# Patient Record
Sex: Female | Born: 1935 | Race: White | Hispanic: No | Smoking: Never smoker
Health system: Southern US, Community
[De-identification: ages and names within clinical notes are randomized; demographics above are authoritative.]

## PROBLEM LIST (undated history)

## (undated) DIAGNOSIS — I2510 Atherosclerotic heart disease of native coronary artery without angina pectoris: Secondary | ICD-10-CM

## (undated) DIAGNOSIS — E785 Hyperlipidemia, unspecified: Secondary | ICD-10-CM

## (undated) DIAGNOSIS — F0280 Dementia in other diseases classified elsewhere without behavioral disturbance: Secondary | ICD-10-CM

## (undated) DIAGNOSIS — I10 Essential (primary) hypertension: Secondary | ICD-10-CM

## (undated) DIAGNOSIS — I519 Heart disease, unspecified: Secondary | ICD-10-CM

## (undated) HISTORY — PX: CHOLECYSTECTOMY: SHX55

## (undated) HISTORY — PX: CORONARY STENT PLACEMENT: SHX1402

## (undated) HISTORY — PX: EYE SURGERY: SHX253

## (undated) HISTORY — PX: ANGIOPLASTY: SHX39

## (undated) HISTORY — PX: ABDOMINAL HYSTERECTOMY: SHX81

---

## 2005-01-07 HISTORY — PX: CORONARY STENT PLACEMENT: SHX1402

## 2014-06-14 ENCOUNTER — Encounter: Payer: Self-pay | Admitting: *Deleted

## 2014-06-14 ENCOUNTER — Emergency Department (INDEPENDENT_AMBULATORY_CARE_PROVIDER_SITE_OTHER)
Admission: EM | Admit: 2014-06-14 | Discharge: 2014-06-14 | Disposition: A | Discharge status: Home / Self Care | Payer: Medicare Other | Source: Home / Self Care | Attending: Family Medicine | Admitting: Family Medicine

## 2014-06-14 DIAGNOSIS — I10 Essential (primary) hypertension: Secondary | ICD-10-CM | POA: Diagnosis not present

## 2014-06-14 DIAGNOSIS — Z136 Encounter for screening for cardiovascular disorders: Secondary | ICD-10-CM | POA: Diagnosis not present

## 2014-06-14 DIAGNOSIS — J0121 Acute recurrent ethmoidal sinusitis: Secondary | ICD-10-CM

## 2014-06-14 HISTORY — DX: Essential (primary) hypertension: I10

## 2014-06-14 HISTORY — DX: Heart disease, unspecified: I51.9

## 2014-06-14 MED ORDER — LEVOFLOXACIN 750 MG PO TABS: 750.0000 mg | ORAL_TABLET | Freq: Every day | ORAL | Status: DC

## 2014-06-14 NOTE — ED Provider Notes (Addendum)
CSN: [id number]     Arrival date & time 06/14/14  1151 History   First MD Initiated Contact with Patient 06/14/14 1153     Chief Complaint  Patient presents with  . Facial Pain  . Otalgia    HPI  SINUSITIS Onset:  2-3 weeks  Location: frontal/ethmoid sinuses  Description:sinus pain pressure/persistent discomfort   Modifying factors: was seen at PCP for similar sxs on 5/27- was placed on extended course of omnicef w/ mild improvement in sxs-still with moderate to severe pain.   Symptoms Cough:  no Discharge:  no Fever: no Sinus Pressure:  yes Ears Blocked:  no Teeth Ache:  mild Frontal Headache:  yes Second Sickening:  no  Red Flags Change in mental state: no Change in vision: no    Past Medical History  Diagnosis Date  . Hypertension   . Heart disease    Past Surgical History  Procedure Laterality Date  . Cholecystectomy    . Abdominal hysterectomy    . Eye surgery    . Coronary stent placement    . Angioplasty     Family History  Problem Relation Age of Onset  . Diabetes Mother   . Alzheimer's disease Father   . Glaucoma Sister   . Diabetes Brother    History  Substance Use Topics  . Smoking status: Never Smoker   . Smokeless tobacco: Never Used  . Alcohol Use: No   OB History    No data available     Review of Systems  All other systems reviewed and are negative.   Allergies  Atenolol; Biaxin; Codeine; Methotrexate derivatives; Metoprolol; Penicillins; Plaquenil; and Sulfur  Home Medications   Prior to Admission medications   Medication Sig Start Date End Date Taking? Authorizing Provider  amLODipine (NORVASC) 10 MG tablet Take 20 mg by mouth daily.   Yes Historical Provider, MD  aspirin 81 MG tablet Take 81 mg by mouth daily.   Yes Historical Provider, MD  carvedilol (COREG) 3.125 MG tablet Take 3.125 mg by mouth 2 (two) times daily with a meal.   Yes Historical Provider, MD  omeprazole (PRILOSEC) 20 MG capsule Take 20 mg by mouth daily.    Yes Historical Provider, MD  Turmeric 500 MG CAPS Take by mouth.   Yes Historical Provider, MD   BP 158/74 mmHg  Pulse 72  Temp(Src) 98.1 F (36.7 C) (Oral)  Resp 16  Ht 5' 1.5" (1.562 m)  Wt 185 lb (83.915 kg)  BMI 34.39 kg/m2  SpO2 98% Physical Exam  Constitutional: She appears well-developed and well-nourished.  HENT:  Head: Normocephalic and atraumatic.  Right Ear: External ear normal.  Left Ear: External ear normal.  + maxillary and ethmoid sinus TTP    Eyes: Conjunctivae are normal. Pupils are equal, round, and reactive to light.  Neck: Normal range of motion.  Cardiovascular: Normal rate and regular rhythm.   Pulmonary/Chest: Effort normal.  Abdominal: Soft.  Musculoskeletal: Normal range of motion.  Lymphadenopathy:    She has cervical adenopathy.  Neurological: She is alert.  Skin: Skin is warm.    ED Course  Procedures (including critical care time) Labs Review Labs Reviewed - No data to display EKG: NSR, QT WNL Imaging Review No results found.   MDM   1. Recurrent ethmoidal sinusitis, unspecified chronicity    Will treat with extended course of levaquin given failed course of omnicef Discussed infectious and ENT red flags at length Screening EKG done w/ NSR and QTc @  405 msec in setting of baseline CAD Follow up w/ ENT if sxs worsen/fail to improve    The patient and/or caregiver has been counseled thoroughly with regard to treatment plan and/or medications prescribed including dosage, schedule, interactions, rationale for use, and possible side effects and they verbalize understanding. Diagnoses and expected course of recovery discussed and will return if not improved as expected or if the condition worsens. Patient and/or caregiver verbalized understanding.         Floydene Flock, MD 06/14/14 1243  Floydene Flock, MD 06/14/14 [phone]

## 2014-06-14 NOTE — ED Notes (Signed)
Pt was treated for sinus infection and lymphadenitis on 06/03/14 with Omnicef. She completed treatment yesterday with minimal improvement. Still c/o left shoulder pain, ear pain and sinus pressure.

## 2015-04-17 ENCOUNTER — Encounter: Payer: Self-pay | Admitting: *Deleted

## 2015-04-17 ENCOUNTER — Emergency Department (INDEPENDENT_AMBULATORY_CARE_PROVIDER_SITE_OTHER)
Admission: EM | Admit: 2015-04-17 | Discharge: 2015-04-17 | Disposition: A | Discharge status: Home / Self Care | Payer: Medicare Other | Source: Home / Self Care | Attending: Family Medicine | Admitting: Family Medicine

## 2015-04-17 DIAGNOSIS — B9789 Other viral agents as the cause of diseases classified elsewhere: Principal | ICD-10-CM

## 2015-04-17 DIAGNOSIS — J069 Acute upper respiratory infection, unspecified: Secondary | ICD-10-CM | POA: Diagnosis not present

## 2015-04-17 MED ORDER — BENZONATATE 200 MG PO CAPS: 200.0000 mg | ORAL_CAPSULE | Freq: Every day | ORAL | Status: DC

## 2015-04-17 MED ORDER — PREDNISONE 20 MG PO TABS: ORAL_TABLET | ORAL | Status: DC

## 2015-04-17 MED ORDER — DOXYCYCLINE HYCLATE 100 MG PO CAPS: 100.0000 mg | ORAL_CAPSULE | Freq: Two times a day (BID) | ORAL | Status: DC

## 2015-04-17 NOTE — ED Notes (Signed)
Pt c/o productive cough and sinus pain x 4 days. Denies fever. She is taking Robt and Mucinex.

## 2015-04-17 NOTE — ED Provider Notes (Signed)
CSN: [id number]     Arrival date & time 04/17/15  1020 History   First MD Initiated Contact with Patient 04/17/15 1200     Chief Complaint  Patient presents with  . Cough      HPI Comments:  Patient complains of five day history of typical cold-like symptoms developing over several days,  including mild sore throat, sinus congestion, fatigue, and cough.  No fevers, chills, and sweats. She has a history of perennial rhinitis and recurrent sinusitis.  She states that colds tend to last a long time. She has a past history of asthma as a child.  Her mother and brother had asthma.  The history is provided by the patient.    Past Medical History  Diagnosis Date  . Hypertension   . Heart disease    Past Surgical History  Procedure Laterality Date  . Cholecystectomy    . Abdominal hysterectomy    . Eye surgery    . Coronary stent placement    . Angioplasty     Family History  Problem Relation Age of Onset  . Diabetes Mother   . Alzheimer's disease Father   . Glaucoma Sister   . Diabetes Brother    Social History  Substance Use Topics  . Smoking status: Never Smoker   . Smokeless tobacco: Never Used  . Alcohol Use: No   OB History    No data available     Review of Systems + sore throat + cough No pleuritic pain No wheezing + nasal congestion + post-nasal drainage + sinus pain/pressure No itchy/red eyes No earache No hemoptysis No SOB No fever/chills No nausea No vomiting No abdominal pain No diarrhea No urinary symptoms No skin rash + fatigue No myalgias No headache Used OTC meds without relief  Allergies  Atenolol; Biaxin; Codeine; Methotrexate derivatives; Metoprolol; Penicillins; Plaquenil; and Sulfur  Home Medications   Prior to Admission medications   Medication Sig Start Date End Date Taking? Authorizing Provider  amLODipine (NORVASC) 10 MG tablet Take 20 mg by mouth daily.   Yes Historical Provider, MD  carvedilol (COREG) 3.125 MG tablet Take  3.125 mg by mouth 2 (two) times daily with a meal.   Yes Historical Provider, MD  DULoxetine (CYMBALTA) 20 MG capsule Take 20 mg by mouth daily.   Yes Historical Provider, MD  furosemide (LASIX) 20 MG tablet Take 20 mg by mouth.   Yes Historical Provider, MD  omeprazole (PRILOSEC) 20 MG capsule Take 20 mg by mouth daily.   Yes Historical Provider, MD  POTASSIUM PO Take by mouth.   Yes Historical Provider, MD  aspirin 81 MG tablet Take 81 mg by mouth daily.    Historical Provider, MD  benzonatate (TESSALON) 200 MG capsule Take 1 capsule (200 mg total) by mouth at bedtime. Take as needed for cough 04/17/15   Lattie Haw, MD  doxycycline (VIBRAMYCIN) 100 MG capsule Take 1 capsule (100 mg total) by mouth 2 (two) times daily. (Rx void after 04/25/15) 04/17/15   Lattie Haw, MD  predniSONE (DELTASONE) 20 MG tablet Take one tab by mouth twice daily for 5 days, then one daily for 3 days. Take with food. 04/17/15   Lattie Haw, MD  Turmeric 500 MG CAPS Take by mouth.    Historical Provider, MD   Meds Ordered and Administered this Visit  Medications - No data to display  BP 125/87 mmHg  Pulse 103  Temp(Src) 98 F (36.7 C) (Oral)  Resp 16  Ht 5' 1.5" (1.562 m)  Wt 184 lb (83.462 kg)  BMI 34.21 kg/m2  SpO2 97% No data found.   Physical Exam Nursing notes and Vital Signs reviewed. Appearance:  Patient appears stated age, and in no acute distress Eyes:  Pupils are equal, round, and reactive to light and accomodation.  Extraocular movement is intact.  Conjunctivae are not inflamed  Ears:  Canals occluded with cerumen. Nose:  Mildly congested turbinates.  No sinus tenderness.    Pharynx:   Uvula mildly edematous, otherwise normal. Neck:  Supple.  Tender enlarged posterior/lateral nodes are palpated bilaterally  Lungs:   Scattered rhonchi posteriorly.  Breath sounds are equal.  Moving air well. Heart:  Regular rate and rhythm without murmurs, rubs, or gallops.  Abdomen:  Nontender without  masses or hepatosplenomegaly.  Bowel sounds are present.  No CVA or flank tenderness.  Extremities:  No edema.  Skin:  No rash present.   ED Course  Procedures  None    MDM   1. Viral URI with cough    Suspect mild bronchospasm. There is no evidence of bacterial infection today.   Begin prednisone burst/taper.  Prescription written for Benzonatate [facility](Tessalon) to take at bedtime for night-time cough.  Take plain guaifenesin (1200mg  extended release tabs such as Mucinex) twice daily, with plenty of water, for cough and congestion.  Get adequate rest.   May use Afrin nasal spray (or generic oxymetazoline) twice daily for about 5 days and then discontinue.  Also recommend using saline nasal spray several times daily and saline nasal irrigation (AYR is a common brand).  Use Flonase nasal spray each morning after using Afrin nasal spray and saline nasal irrigation. Try warm salt water gargles for sore throat.  Stop all antihistamines for now, and other non-prescription cough/cold preparations. Begin Doxycycline if not improving about one week or if persistent fever develops (Given a prescription to hold, with an expiration date)  Follow-up with family doctor if not improving about10 days.     Lattie HawStephen A Dirck Butch, MD 04/17/15 1224

## 2015-04-17 NOTE — Discharge Instructions (Signed)
Take plain guaifenesin (1200mg extended release tabs such as Mucinex) twice daily, with plenty of water, for cough and congestion.   Get adequate rest.   °May use Afrin nasal spray (or generic oxymetazoline) twice daily for about 5 days and then discontinue.  Also recommend using saline nasal spray several times daily and saline nasal irrigation (AYR is a common brand).  Use Flonase nasal spray each morning after using Afrin nasal spray and saline nasal irrigation. °Try warm salt water gargles for sore throat.  °Stop all antihistamines for now, and other non-prescription cough/cold preparations. °Begin Doxycycline if not improving about one week or if persistent fever develops   °Follow-up with family doctor if not improving about10 days.  °

## 2019-08-23 ENCOUNTER — Inpatient Hospital Stay (HOSPITAL_COMMUNITY)
Admission: EM | Admit: 2019-08-23 | Discharge: 2019-08-26 | DRG: 522 | Disposition: A | Discharge status: Home Health | Payer: Medicare Other | Attending: Internal Medicine | Admitting: Internal Medicine

## 2019-08-23 ENCOUNTER — Inpatient Hospital Stay (HOSPITAL_COMMUNITY): Payer: Medicare Other

## 2019-08-23 ENCOUNTER — Other Ambulatory Visit: Payer: Self-pay

## 2019-08-23 ENCOUNTER — Emergency Department (HOSPITAL_COMMUNITY): Payer: Medicare Other

## 2019-08-23 ENCOUNTER — Encounter (HOSPITAL_COMMUNITY): Payer: Self-pay

## 2019-08-23 DIAGNOSIS — Y92009 Unspecified place in unspecified non-institutional (private) residence as the place of occurrence of the external cause: Secondary | ICD-10-CM | POA: Diagnosis not present

## 2019-08-23 DIAGNOSIS — W19XXXA Unspecified fall, initial encounter: Secondary | ICD-10-CM

## 2019-08-23 DIAGNOSIS — Z01818 Encounter for other preprocedural examination: Secondary | ICD-10-CM | POA: Diagnosis present

## 2019-08-23 DIAGNOSIS — S72001A Fracture of unspecified part of neck of right femur, initial encounter for closed fracture: Secondary | ICD-10-CM | POA: Diagnosis not present

## 2019-08-23 DIAGNOSIS — F329 Major depressive disorder, single episode, unspecified: Secondary | ICD-10-CM | POA: Diagnosis present

## 2019-08-23 DIAGNOSIS — Z20822 Contact with and (suspected) exposure to covid-19: Secondary | ICD-10-CM | POA: Diagnosis present

## 2019-08-23 DIAGNOSIS — I2510 Atherosclerotic heart disease of native coronary artery without angina pectoris: Secondary | ICD-10-CM | POA: Diagnosis present

## 2019-08-23 DIAGNOSIS — S72009A Fracture of unspecified part of neck of unspecified femur, initial encounter for closed fracture: Secondary | ICD-10-CM

## 2019-08-23 DIAGNOSIS — G309 Alzheimer's disease, unspecified: Secondary | ICD-10-CM | POA: Diagnosis present

## 2019-08-23 DIAGNOSIS — Z79899 Other long term (current) drug therapy: Secondary | ICD-10-CM | POA: Diagnosis not present

## 2019-08-23 DIAGNOSIS — I10 Essential (primary) hypertension: Secondary | ICD-10-CM | POA: Diagnosis present

## 2019-08-23 DIAGNOSIS — Z90710 Acquired absence of both cervix and uterus: Secondary | ICD-10-CM

## 2019-08-23 DIAGNOSIS — Z955 Presence of coronary angioplasty implant and graft: Secondary | ICD-10-CM

## 2019-08-23 DIAGNOSIS — E785 Hyperlipidemia, unspecified: Secondary | ICD-10-CM | POA: Diagnosis present

## 2019-08-23 DIAGNOSIS — Z7982 Long term (current) use of aspirin: Secondary | ICD-10-CM | POA: Diagnosis not present

## 2019-08-23 DIAGNOSIS — I252 Old myocardial infarction: Secondary | ICD-10-CM

## 2019-08-23 DIAGNOSIS — G3184 Mild cognitive impairment, so stated: Secondary | ICD-10-CM | POA: Diagnosis not present

## 2019-08-23 DIAGNOSIS — S0990XA Unspecified injury of head, initial encounter: Secondary | ICD-10-CM

## 2019-08-23 DIAGNOSIS — F0280 Dementia in other diseases classified elsewhere without behavioral disturbance: Secondary | ICD-10-CM | POA: Diagnosis present

## 2019-08-23 DIAGNOSIS — S72011A Unspecified intracapsular fracture of right femur, initial encounter for closed fracture: Secondary | ICD-10-CM | POA: Diagnosis present

## 2019-08-23 DIAGNOSIS — D649 Anemia, unspecified: Secondary | ICD-10-CM | POA: Diagnosis not present

## 2019-08-23 DIAGNOSIS — K5900 Constipation, unspecified: Secondary | ICD-10-CM | POA: Diagnosis present

## 2019-08-23 DIAGNOSIS — W010XXA Fall on same level from slipping, tripping and stumbling without subsequent striking against object, initial encounter: Secondary | ICD-10-CM | POA: Diagnosis present

## 2019-08-23 DIAGNOSIS — Z96649 Presence of unspecified artificial hip joint: Secondary | ICD-10-CM

## 2019-08-23 HISTORY — DX: Hyperlipidemia, unspecified: E78.5

## 2019-08-23 HISTORY — DX: Atherosclerotic heart disease of native coronary artery without angina pectoris: I25.10

## 2019-08-23 HISTORY — DX: Dementia in other diseases classified elsewhere, unspecified severity, without behavioral disturbance, psychotic disturbance, mood disturbance, and anxiety: F02.80

## 2019-08-23 LAB — BASIC METABOLIC PANEL
Anion gap: 8 (ref 5–15)
BUN: 24 mg/dL — ABNORMAL HIGH (ref 8–23)
CO2: 23 mmol/L (ref 22–32)
Calcium: 9.3 mg/dL (ref 8.9–10.3)
Chloride: 106 mmol/L (ref 98–111)
Creatinine, Ser: 0.79 mg/dL (ref 0.44–1.00)
GFR calc Af Amer: >60 mL/min (ref >60)
GFR calc non Af Amer: >60 mL/min (ref >60)
Glucose, Bld: 119 mg/dL — ABNORMAL HIGH (ref 70–99)
Potassium: 3.7 mmol/L (ref 3.5–5.1)
Sodium: 137 mmol/L (ref 135–145)

## 2019-08-23 LAB — CBC WITH DIFFERENTIAL/PLATELET
Abs Immature Granulocytes: 0.02 10*3/uL (ref 0.00–0.07)
Basophils Absolute: 0.1 10*3/uL (ref 0.0–0.1)
Basophils Relative: 1 %
Eosinophils Absolute: 0.7 10*3/uL — ABNORMAL HIGH (ref 0.0–0.5)
Eosinophils Relative: 9 %
HCT: 37.6 % (ref 36.0–46.0)
Hemoglobin: 12.4 g/dL (ref 12.0–15.0)
Immature Granulocytes: 0 %
Lymphocytes Relative: 26 %
Lymphs Abs: 2 10*3/uL (ref 0.7–4.0)
MCH: 32.6 pg (ref 26.0–34.0)
MCHC: 33 g/dL (ref 30.0–36.0)
MCV: 98.9 fL (ref 80.0–100.0)
Monocytes Absolute: 0.4 10*3/uL (ref 0.1–1.0)
Monocytes Relative: 5 %
Neutro Abs: 4.6 10*3/uL (ref 1.7–7.7)
Neutrophils Relative %: 59 %
Platelets: 204 10*3/uL (ref 150–400)
RBC: 3.8 MIL/uL — ABNORMAL LOW (ref 3.87–5.11)
RDW: 12.7 % (ref 11.5–15.5)
WBC: 7.7 10*3/uL (ref 4.0–10.5)
nRBC: 0 % (ref 0.0–0.2)

## 2019-08-23 LAB — TYPE AND SCREEN
ABO/RH(D): A POS
Antibody Screen: NEGATIVE

## 2019-08-23 LAB — SARS CORONAVIRUS 2 BY RT PCR (HOSPITAL ORDER, PERFORMED IN ~~LOC~~ HOSPITAL LAB): SARS Coronavirus 2: NEGATIVE

## 2019-08-23 MED ORDER — FENTANYL CITRATE (PF) 100 MCG/2ML IJ SOLN: 12.5000 ug | INTRAMUSCULAR | Status: DC | PRN

## 2019-08-23 MED ORDER — MORPHINE SULFATE (PF) 2 MG/ML IV SOLN: 0.5000 mg | INTRAVENOUS | Status: DC | PRN

## 2019-08-23 MED ORDER — HYDROCODONE-ACETAMINOPHEN 5-325 MG PO TABS
1.0000 | ORAL_TABLET | Freq: Four times a day (QID) | ORAL | Status: DC | PRN
Start: 2019-08-23 — End: 2019-08-23

## 2019-08-23 MED ORDER — SERTRALINE HCL 50 MG PO TABS
50.0000 mg | ORAL_TABLET | Freq: Every day | ORAL | Status: DC
  Administered 2019-08-24 – 2019-08-26 (×3): 50 mg via ORAL
  Filled 2019-08-23 (×3): qty 1

## 2019-08-23 MED ORDER — ATORVASTATIN CALCIUM 20 MG PO TABS
20.0000 mg | ORAL_TABLET | Freq: Every day | ORAL | Status: DC
  Administered 2019-08-24 – 2019-08-26 (×3): 20 mg via ORAL
  Filled 2019-08-23 (×3): qty 1

## 2019-08-23 NOTE — ED Provider Notes (Signed)
[facility]-EMERGENCY DEPT Provider Note   CSN: [id number] Arrival date & time: 08/23/19  1151     History Chief Complaint  Patient presents with  . Hip Pain    Fractured    Abigail Fowler is a 84 y.o. female with history of hypertension, Alzheimer's dementia, presenting emergency department with complaint of right hip fracture.  Patient had a mechanical fall on Friday at home.  She was seen by Dr. Darrelyn Hillock today [facility], and had hip fracture on xray.  She states she did hit her head and had small wound to her right scalp.  She had headache after the fall however not today.  She denies nausea or vomiting, vision changes, neck or back pain.  She reports some right knee pain and bruising, no other injuries.  Her symptoms have been well managed with Tylenol.  Denies numbness in her extremities.  She has been ambulating on the right leg.  Daughter reports she is at her mental baseline regarding dementia.  The history is provided by the patient and a relative.       Past Medical History:  Diagnosis Date  . Alzheimer's disease (HCC)   . CAD (coronary artery disease)   . Heart disease   . HLD (hyperlipidemia)   . Hypertension     There are no problems to display for this patient.   Past Surgical History:  Procedure Laterality Date  . ABDOMINAL HYSTERECTOMY    . ANGIOPLASTY    . CHOLECYSTECTOMY    . CORONARY STENT PLACEMENT    . EYE SURGERY       OB History   No obstetric history on file.     Family History  Problem Relation Age of Onset  . Diabetes Mother   . Alzheimer's disease Father   . Glaucoma Sister   . Diabetes Brother     Social History   Tobacco Use  . Smoking status: Never Smoker  . Smokeless tobacco: Never Used  Substance Use Topics  . Alcohol use: No  . Drug use: No    Home Medications Prior to Admission medications   Medication Sig Start Date End Date Taking? Authorizing Provider  amLODipine (NORVASC) 10 MG tablet Take 20  mg by mouth daily.    [provider]  aspirin 81 MG tablet Take 81 mg by mouth daily.    [provider]  benzonatate (TESSALON) 200 MG capsule Take 1 capsule (200 mg total) by mouth at bedtime. Take as needed for cough 04/17/15   Lattie Haw, MD  carvedilol (COREG) 3.125 MG tablet Take 3.125 mg by mouth 2 (two) times daily with a meal.    [provider]  doxycycline (VIBRAMYCIN) 100 MG capsule Take 1 capsule (100 mg total) by mouth 2 (two) times daily. (Rx void after 04/25/15) 04/17/15   Lattie Haw, MD  DULoxetine (CYMBALTA) 20 MG capsule Take 20 mg by mouth daily.    [provider]  furosemide (LASIX) 20 MG tablet Take 20 mg by mouth.    [provider]  omeprazole (PRILOSEC) 20 MG capsule Take 20 mg by mouth daily.    [provider]  POTASSIUM PO Take by mouth.    [provider]  predniSONE (DELTASONE) 20 MG tablet Take one tab by mouth twice daily for 5 days, then one daily for 3 days. Take with food. 04/17/15   Lattie Haw, MD  Turmeric 500 MG CAPS Take by mouth.    [provider]    Allergies    Atenolol, Biaxin [clarithromycin], Codeine, Methotrexate derivatives, Metoprolol, Penicillins, Plaquenil [hydroxychloroquine sulfate], and Sulfur  Review of Systems   Review of Systems  All other systems reviewed and are negative.   Physical Exam Updated Vital Signs BP (!) 143/84 (BP Location: Right Arm)   Pulse 66   Temp 98.7 F (37.1 C) (Oral)   Resp 18   Ht 5' (1.524 m)   Wt 60.8 kg   SpO2 97%   BMI 26.17 kg/m   Physical Exam Vitals and nursing note reviewed.  Constitutional:      Appearance: She is well-developed.  HENT:     Head: Normocephalic.     Comments: Superficial scabbed wound to right posterior parietal scalp Eyes:     Conjunctiva/sclera: Conjunctivae normal.  Cardiovascular:     Rate and Rhythm: Normal rate and regular rhythm.  Pulmonary:     Effort: Pulmonary effort is  normal.     Breath sounds: Normal breath sounds.  Abdominal:     General: Bowel sounds are normal.     Palpations: Abdomen is soft.     Tenderness: There is no abdominal tenderness.  Musculoskeletal:     Cervical back: Normal range of motion and neck supple.     Comments: Right knee with bruising and TTP, no swelling or deformity. Right leg/hip without obvious deformity.   Skin:    General: Skin is warm.  Neurological:     Mental Status: She is alert.     Comments: Mental Status:  Alert, oriented, thought content appropriate, able to give a coherent history. Speech fluent without evidence of aphasia. Able to follow 2 step commands without difficulty.  Cranial Nerves: grossly intact, PERRL EOM nl. Motor:  Normal tone.  equal grip strength BUE Sensory: grossly normal in all extremities.  CV: distal pulses palpable throughout    Psychiatric:        Behavior: Behavior normal.     ED Results / Procedures / Treatments   Labs (all labs ordered are listed, but only abnormal results are displayed) Labs Reviewed  BASIC METABOLIC PANEL - Abnormal; Notable for the following components:      Result Value   Glucose, Bld 119 (*)    BUN 24 (*)    All other components within normal limits  CBC WITH DIFFERENTIAL/PLATELET - Abnormal; Notable for the following components:   RBC 3.80 (*)    Eosinophils Absolute 0.7 (*)    All other components within normal limits  SARS CORONAVIRUS 2 BY RT PCR (HOSPITAL ORDER, PERFORMED IN [facility])  TYPE AND SCREEN  ABO/RH    EKG None  Radiology CT Head Wo Contrast  Result Date: 08/23/2019 CLINICAL DATA:  Head trauma, minor. Additional provided: Fall last Friday night hitting back of head. EXAM: CT HEAD WITHOUT CONTRAST TECHNIQUE: Contiguous axial images were obtained from the base of the skull through the vertex without intravenous contrast. COMPARISON:  No pertinent prior exams are available for comparison. FINDINGS: Brain:  Mild-to-moderate generalized parenchymal atrophy. Mild to moderate ill-defined hypoattenuation within the cerebral white matter which is nonspecific, but consistent with chronic small vessel ischemic disease. There is no acute intracranial hemorrhage. No demarcated cortical infarct. No extra-axial fluid collection. No evidence of intracranial mass. No midline shift. Vascular: No hyperdense vessel.  Atherosclerotic calcifications Skull: Normal. Negative for fracture or focal lesion. Sinuses/Orbits: Visualized orbits show no acute finding. Minimal ethmoid and maxillary sinus mucosal thickening. No significant mastoid effusion. IMPRESSION: No  CT evidence of acute intracranial abnormality. Mild-to-moderate generalized parenchymal atrophy and chronic small vessel ischemic disease. Mild paranasal sinus mucosal thickening. Electronically Signed   By: Jackey Loge DO   On: 08/23/2019 17:37   DG Knee Complete 4 Views Right  Result Date: 08/23/2019 CLINICAL DATA:  Larey Seat, right knee pain and ecchymosis EXAM: RIGHT KNEE - COMPLETE 4+ VIEW COMPARISON:  None. FINDINGS: Frontal, bilateral oblique, lateral views of the right knee are obtained. No acute fracture, subluxation, or dislocation. There is mild 3 compartmental osteoarthritis greatest in the medial compartment. Soft tissue calcification is seen in the infrapatellar fat pad. No joint effusion. IMPRESSION: 1. Three compartmental osteoarthritis. 2. No acute displaced fracture. Electronically Signed   By: Sharlet Salina M.D.   On: 08/23/2019 16:59    Procedures Procedures (including critical care time)  Medications Ordered in ED Medications - No data to display  ED Course  I have reviewed the triage vital signs and the nursing notes.  Pertinent labs & imaging results that were available during my care of the patient were reviewed by me and considered in my medical decision making (see chart for details).  Clinical Course as of Aug 23 1918  [facility] Aug 23, 2019    1726 Ortho, Dr. Rennis Chris consulted, Dr. Charlann Boxer to operate tomorrow. Does not need repeat films at this time   [JR]    Clinical Course User Index [JR] Magaby Rumberger, [place] N, PA-C   MDM Rules/Calculators/A&P                          Patient presenting with diagnosed right hip fracture at [facility] today, sent in for admission and ORIF tentatively tomorrow.  She had mechanical fall on Friday with minor head trauma, no LOC.  She initially had a headache, however has resolved.  No focal neuro deficits appreciated.  She has no anticoagulation.  She endorses some right knee pain as well, does not believe it was imaged at the orthopedist.  Pain is managed well with Tylenol.  CT head is negative, right knee film was also negative for acute injury.  Consulted with Dr. Rennis Chris with [facility], he spoke with Dr. Charlann Boxer who plans for operative repair tomorrow.  They do not need a repeat image of her hip at this time (and it is not viewable in epic or pacs)   Final Clinical Impression(s) / ED Diagnoses Final diagnoses:  Closed right hip fracture, initial encounter [facility])  Fall in home, initial encounter  Minor head injury, initial encounter    Rx / DC Orders ED Discharge Orders    None       Mykaela Arena, [place] N, PA-C 08/23/19 Glorious Peach, MD 08/24/19 1028

## 2019-08-23 NOTE — H&P (View-Only) (Signed)
Reason for Consult: Right hip fracture Referring Physician: Julian Reil, MD (Hospitalist)  Abigail Fowler is an 84 y.o. female.  HPI: 84 yo female who was directed to our office after fall Friday evening.  She was able to bear weight thus was seen to evaluate for possible injury.  Radiographs in the office revealed that she had a right femoral neck fracture. Given these findings she was sent to the ER to be admitted to the Hospitalist service per protocol. No other injuries reported or identified.  Past Medical History:  Diagnosis Date   Alzheimer's disease (HCC)    CAD (coronary artery disease)    1) DES to LAD in 2007 2) normal stress echo EF 60-65% in 2016   Heart disease    HLD (hyperlipidemia)    Hypertension     Past Surgical History:  Procedure Laterality Date   ABDOMINAL HYSTERECTOMY     ANGIOPLASTY     CHOLECYSTECTOMY     CORONARY STENT PLACEMENT  2007   DES to LAD   EYE SURGERY      Family History  Problem Relation Age of Onset   Diabetes Mother    Alzheimer's disease Father    Glaucoma Sister    Diabetes Brother     Social History:  reports that she has never smoked. She has never used smokeless tobacco. She reports that she does not drink alcohol and does not use drugs.  Allergies:  Allergies  Allergen Reactions   Codeine Hives, Swelling and Anxiety    Throat swelling   Sulfa Antibiotics Hives and Rash   Methotrexate Other (See Comments)    "flu-like symptoms, weakness"   Penicillins Swelling and Hypertension   Plaquenil [Hydroxychloroquine Sulfate] Nausea And Vomiting and Other (See Comments)    Abdominal pain, GI upset   Atenolol Anxiety and Palpitations   Biaxin [Clarithromycin] Palpitations and Other (See Comments)    Chest pains   Metoprolol Anxiety and Palpitations    Medications:  I have reviewed the patient's current medications. Scheduled:  [START ON 08/24/2019] atorvastatin  20 mg Oral Daily   [START ON 08/24/2019]  sertraline  50 mg Oral Daily    Results for orders placed or performed during the hospital encounter of 08/23/19 (from the past 24 hour(s))  Basic metabolic panel     Status: Abnormal   Collection Time: 08/23/19  3:07 PM  Result Value Ref Range   Sodium 137 135 - 145 mmol/L   Potassium 3.7 3.5 - 5.1 mmol/L   Chloride 106 98 - 111 mmol/L   CO2 23 22 - 32 mmol/L   Glucose, Bld 119 (H) 70 - 99 mg/dL   BUN 24 (H) 8 - 23 mg/dL   Creatinine, Ser 8.25 0.44 - 1.00 mg/dL   Calcium 9.3 8.9 - 05.3 mg/dL   GFR calc non Af Amer >60 >60 mL/min   GFR calc Af Amer >60 >60 mL/min   Anion gap 8 5 - 15  CBC with Differential     Status: Abnormal   Collection Time: 08/23/19  3:07 PM  Result Value Ref Range   WBC 7.7 4.0 - 10.5 K/uL   RBC 3.80 (L) 3.87 - 5.11 MIL/uL   Hemoglobin 12.4 12.0 - 15.0 g/dL   HCT 97.6 36 - 46 %   MCV 98.9 80.0 - 100.0 fL   MCH 32.6 26.0 - 34.0 pg   MCHC 33.0 30.0 - 36.0 g/dL   RDW 73.4 19.3 - 79.0 %   Platelets 204 150 -  400 K/uL  ° nRBC 0.0 0.0 - 0.2 %  ° Neutrophils Relative % 59 %  ° Neutro Abs 4.6 1.7 - 7.7 K/uL  ° Lymphocytes Relative 26 %  ° Lymphs Abs 2.0 0.7 - 4.0 K/uL  ° Monocytes Relative 5 %  ° Monocytes Absolute 0.4 0 - 1 K/uL  ° Eosinophils Relative 9 %  ° Eosinophils Absolute 0.7 (H) 0 - 0 K/uL  ° Basophils Relative 1 %  ° Basophils Absolute 0.1 0 - 0 K/uL  ° Immature Granulocytes 0 %  ° Abs Immature Granulocytes 0.02 0.00 - 0.07 K/uL  °Type and screen [facility]     Status: None  ° Collection Time: 08/23/19  3:07 PM  °Result Value Ref Range  ° ABO/RH(D) A POS   ° Antibody Screen NEG   ° Sample Expiration    °  08/26/2019,2359 °Performed at [facility], [address] [place], [place] [address] °  °SARS Coronavirus 2 by RT PCR (hospital order, performed in [facility] hospital lab) Nasopharyngeal Nasopharyngeal Swab     Status: None  ° Collection Time: 08/23/19  3:12 PM  ° Specimen: Nasopharyngeal Swab  °Result Value Ref Range   ° SARS Coronavirus 2 NEGATIVE NEGATIVE  ° ° ° °X-ray: °Radiographs from our office reveal a valgus impacted subcapital femoral neck fracture ° °ROS: °Per admitting HPI ° °Blood pressure (!) 143/78, pulse 74, temperature 98 °F (36.7 °C), temperature source Oral, resp. rate 16, height 5' (1.524 m), weight 60.8 kg, SpO2 100 %. ° °Physical Exam: °Constitutional: NAD, calm, comfortable °Eyes: PERRL, lids and conjunctivae normal °ENMT: Mucous membranes are moist. Posterior pharynx clear of any exudate or lesions.Normal dentition.  °Neck: normal, supple, no masses, no thyromegaly °Respiratory: clear to auscultation bilaterally, no wheezing, no crackles. Normal respiratory effort. No accessory muscle use.  °Cardiovascular: Regular rate and rhythm, no murmurs / rubs / gallops. No extremity edema. 2+ pedal pulses. No carotid bruits.  °Abdomen: no tenderness, no masses palpated. No hepatosplenomegaly. Bowel sounds positive.  °Musculoskeletal: R knee with bruising and TTP.  Right hip but able to move her RLE, no significant shortening °Skin: no rashes, lesions, ulcers. No induration °Neurologic: CN 2-12 grossly intact. Sensation intact, DTR normal. Strength 5/5 in all 4.  °Psychiatric: Normal judgment and insight. Alert and oriented x 3. Normal mood.  °  ° °Assessment/Plan: °1. Right femoral neck hip fracture ° °Plan: °NPO after MN °I will discuss with her treatment options of percutaneous screw fixation verus arthroplasty °Post op course to be directed based on procedure and findings ° °After reviewing her injury I feel that it would best to proceed with a total hip replacement to eliminate risks of non union with cannulated screws or pain within the acetabulum with a hemiarthroplasty. ° °To OR today for right total hip repalcement ° °Deen Deguia D Faith Branan °08/23/2019, 10:15 PM  ° ° ° ° °

## 2019-08-23 NOTE — ED Triage Notes (Signed)
Pt brought in by daughter (POA). Pt here to be admitted for right hip fracture. Told to check in through ED.   Pt came from [facility] by Dr. Charlann Boxer.  Pt fell last Friday night.  Denies pain at this time.    Patient in wheelchair during triage.

## 2019-08-23 NOTE — Consult Note (Signed)
Reason for Consult: Right hip fracture Referring Physician: Julian Reil, MD (Hospitalist)  Abigail Fowler is an 84 y.o. female.  HPI: 84 yo female who was directed to our office after fall Friday evening.  She was able to bear weight thus was seen to evaluate for possible injury.  Radiographs in the office revealed that she had a right femoral neck fracture. Given these findings she was sent to the ER to be admitted to the Hospitalist service per protocol. No other injuries reported or identified.  Past Medical History:  Diagnosis Date   Alzheimer's disease (HCC)    CAD (coronary artery disease)    1) DES to LAD in 2007 2) normal stress echo EF 60-65% in 2016   Heart disease    HLD (hyperlipidemia)    Hypertension     Past Surgical History:  Procedure Laterality Date   ABDOMINAL HYSTERECTOMY     ANGIOPLASTY     CHOLECYSTECTOMY     CORONARY STENT PLACEMENT  2007   DES to LAD   EYE SURGERY      Family History  Problem Relation Age of Onset   Diabetes Mother    Alzheimer's disease Father    Glaucoma Sister    Diabetes Brother     Social History:  reports that she has never smoked. She has never used smokeless tobacco. She reports that she does not drink alcohol and does not use drugs.  Allergies:  Allergies  Allergen Reactions   Codeine Hives, Swelling and Anxiety    Throat swelling   Sulfa Antibiotics Hives and Rash   Methotrexate Other (See Comments)    "flu-like symptoms, weakness"   Penicillins Swelling and Hypertension   Plaquenil [Hydroxychloroquine Sulfate] Nausea And Vomiting and Other (See Comments)    Abdominal pain, GI upset   Atenolol Anxiety and Palpitations   Biaxin [Clarithromycin] Palpitations and Other (See Comments)    Chest pains   Metoprolol Anxiety and Palpitations    Medications:  I have reviewed the patient's current medications. Scheduled:  [START ON 08/24/2019] atorvastatin  20 mg Oral Daily   [START ON 08/24/2019]  sertraline  50 mg Oral Daily    Results for orders placed or performed during the hospital encounter of 08/23/19 (from the past 24 hour(s))  Basic metabolic panel     Status: Abnormal   Collection Time: 08/23/19  3:07 PM  Result Value Ref Range   Sodium 137 135 - 145 mmol/L   Potassium 3.7 3.5 - 5.1 mmol/L   Chloride 106 98 - 111 mmol/L   CO2 23 22 - 32 mmol/L   Glucose, Bld 119 (H) 70 - 99 mg/dL   BUN 24 (H) 8 - 23 mg/dL   Creatinine, Ser 8.25 0.44 - 1.00 mg/dL   Calcium 9.3 8.9 - 05.3 mg/dL   GFR calc non Af Amer >60 >60 mL/min   GFR calc Af Amer >60 >60 mL/min   Anion gap 8 5 - 15  CBC with Differential     Status: Abnormal   Collection Time: 08/23/19  3:07 PM  Result Value Ref Range   WBC 7.7 4.0 - 10.5 K/uL   RBC 3.80 (L) 3.87 - 5.11 MIL/uL   Hemoglobin 12.4 12.0 - 15.0 g/dL   HCT 97.6 36 - 46 %   MCV 98.9 80.0 - 100.0 fL   MCH 32.6 26.0 - 34.0 pg   MCHC 33.0 30.0 - 36.0 g/dL   RDW 73.4 19.3 - 79.0 %   Platelets 204 150 -  400 K/uL   nRBC 0.0 0.0 - 0.2 %   Neutrophils Relative % 59 %   Neutro Abs 4.6 1.7 - 7.7 K/uL   Lymphocytes Relative 26 %   Lymphs Abs 2.0 0.7 - 4.0 K/uL   Monocytes Relative 5 %   Monocytes Absolute 0.4 0 - 1 K/uL   Eosinophils Relative 9 %   Eosinophils Absolute 0.7 (H) 0 - 0 K/uL   Basophils Relative 1 %   Basophils Absolute 0.1 0 - 0 K/uL   Immature Granulocytes 0 %   Abs Immature Granulocytes 0.02 0.00 - 0.07 K/uL  Type and screen [facility]     Status: None   Collection Time: 08/23/19  3:07 PM  Result Value Ref Range   ABO/RH(D) A POS    Antibody Screen NEG    Sample Expiration      08/26/2019,2359 Performed at [facility], [address], [place], [place] [address]   SARS Coronavirus 2 by RT PCR (hospital order, performed in [facility] hospital lab) Nasopharyngeal Nasopharyngeal Swab     Status: None   Collection Time: 08/23/19  3:12 PM   Specimen: Nasopharyngeal Swab  Result Value Ref Range    SARS Coronavirus 2 NEGATIVE NEGATIVE     X-ray: Radiographs from our office reveal a valgus impacted subcapital femoral neck fracture  ROS: Per admitting HPI  Blood pressure (!) 143/78, pulse 74, temperature 98 F (36.7 C), temperature source Oral, resp. rate 16, height 5' (1.524 m), weight 60.8 kg, SpO2 100 %.  Physical Exam: Constitutional: NAD, calm, comfortable Eyes: PERRL, lids and conjunctivae normal ENMT: Mucous membranes are moist. Posterior pharynx clear of any exudate or lesions.Normal dentition.  Neck: normal, supple, no masses, no thyromegaly Respiratory: clear to auscultation bilaterally, no wheezing, no crackles. Normal respiratory effort. No accessory muscle use.  Cardiovascular: Regular rate and rhythm, no murmurs / rubs / gallops. No extremity edema. 2+ pedal pulses. No carotid bruits.  Abdomen: no tenderness, no masses palpated. No hepatosplenomegaly. Bowel sounds positive.  Musculoskeletal: R knee with bruising and TTP.  Right hip but able to move her RLE, no significant shortening Skin: no rashes, lesions, ulcers. No induration Neurologic: CN 2-12 grossly intact. Sensation intact, DTR normal. Strength 5/5 in all 4.  Psychiatric: Normal judgment and insight. Alert and oriented x 3. Normal mood.    Assessment/Plan: 1. Right femoral neck hip fracture  Plan: NPO after MN I will discuss with her treatment options of percutaneous screw fixation verus arthroplasty Post op course to be directed based on procedure and findings  After reviewing her injury I feel that it would best to proceed with a total hip replacement to eliminate risks of non union with cannulated screws or pain within the acetabulum with a hemiarthroplasty.  To OR today for right total hip repalcement  [name] 08/23/2019, 10:15 PM

## 2019-08-23 NOTE — H&P (Signed)
History and Physical    Abigail Fowler IEP:[id number] DOB: 1935/06/11 DOA: 08/23/2019  PCP: Britta Mccreedy, MD  Patient coming from: Home  I have personally briefly reviewed patient's old medical records in [facility]  Chief Complaint: Hip fx  HPI: Abigail Fowler is a 84 y.o. female with medical history significant of MCI, HTN, CAD s/p DES to LAD in 2007, normal EF and normal stress echo in 2016.  Pt had mechanical fall on Friday at home.  Because she was "stubborn" (self described), pt continued to walk (somehow) on her painful R hip.  Due to ongoing severe R hip pain with ambulation she went in to [facility] office today.  Dr. Darrelyn Hillock reportedly got X ray in office today that showed R hip fracture.  Pt sent in from [facility] office to ED for admission, plan is for Dr. Charlann Boxer to do surgery tomorrow.  Did hit head with fall, small wound to R scalp.   ED Course: CT head neg.  EDP spoke with Dr. Rennis Chris as we cant see the X rays from [facility] office here.  He didn't feel pt needed repeat X ray since they had one in office.  He told EDP that plan is for Dr. Charlann Boxer to do surgery in AM.   Review of Systems: As per HPI, otherwise all review of systems negative.  Past Medical History:  Diagnosis Date  . Alzheimer's disease (HCC)   . CAD (coronary artery disease)    1) DES to LAD in 2007 2) normal stress echo EF 60-65% in 2016  . Heart disease   . HLD (hyperlipidemia)   . Hypertension     Past Surgical History:  Procedure Laterality Date  . ABDOMINAL HYSTERECTOMY    . ANGIOPLASTY    . CHOLECYSTECTOMY    . CORONARY STENT PLACEMENT  2007   DES to LAD  . EYE SURGERY       reports that she has never smoked. She has never used smokeless tobacco. She reports that she does not drink alcohol and does not use drugs.  Allergies  Allergen Reactions  . Sulfa Antibiotics Hives and Rash  . Methotrexate Other (See Comments)    "flu-like symptoms, weakness"  . Penicillins Swelling  and Hypertension  . Plaquenil [Hydroxychloroquine Sulfate] Nausea And Vomiting and Other (See Comments)    Abdominal pain, GI upset  . Atenolol Anxiety and Palpitations  . Biaxin [Clarithromycin] Palpitations and Other (See Comments)    Chest pains  . Codeine Hives and Anxiety  . Metoprolol Anxiety and Palpitations    Family History  Problem Relation Age of Onset  . Diabetes Mother   . Alzheimer's disease Father   . Glaucoma Sister   . Diabetes Brother      Prior to Admission medications   Medication Sig Start Date End Date Taking? Authorizing Provider  amLODipine (NORVASC) 10 MG tablet Take 20 mg by mouth daily.    [provider]  aspirin 81 MG tablet Take 81 mg by mouth daily.    [provider]  benzonatate (TESSALON) 200 MG capsule Take 1 capsule (200 mg total) by mouth at bedtime. Take as needed for cough 04/17/15   Lattie Haw, MD  carvedilol (COREG) 3.125 MG tablet Take 3.125 mg by mouth 2 (two) times daily with a meal.    [provider]  doxycycline (VIBRAMYCIN) 100 MG capsule Take 1 capsule (100 mg total) by mouth 2 (two) times daily. (Rx void after 04/25/15) 04/17/15  Lattie HawBeese, Stephen A, MD  DULoxetine (CYMBALTA) 20 MG capsule Take 20 mg by mouth daily.    [provider]  furosemide (LASIX) 20 MG tablet Take 20 mg by mouth.    [provider]  omeprazole (PRILOSEC) 20 MG capsule Take 20 mg by mouth daily.    [provider]  POTASSIUM PO Take by mouth.    [provider]  predniSONE (DELTASONE) 20 MG tablet Take one tab by mouth twice daily for 5 days, then one daily for 3 days. Take with food. 04/17/15   Lattie HawBeese, Stephen A, MD  Turmeric 500 MG CAPS Take by mouth.    [provider]    Physical Exam: Vitals:   08/23/19 1453 08/23/19 1714 08/23/19 1900 08/23/19 1939  BP: 136/65 (!) 143/84 (!) 136/118 (!) 151/71  Pulse: 78 66 68 66  Resp: 18 18 18 12   Temp:      TempSrc:    Oral  SpO2: 100% 97%  100% 100%  Weight:      Height:        Constitutional: NAD, calm, comfortable Eyes: PERRL, lids and conjunctivae normal ENMT: Mucous membranes are moist. Posterior pharynx clear of any exudate or lesions.Normal dentition.  Neck: normal, supple, no masses, no thyromegaly Respiratory: clear to auscultation bilaterally, no wheezing, no crackles. Normal respiratory effort. No accessory muscle use.  Cardiovascular: Regular rate and rhythm, no murmurs / rubs / gallops. No extremity edema. 2+ pedal pulses. No carotid bruits.  Abdomen: no tenderness, no masses palpated. No hepatosplenomegaly. Bowel sounds positive.  Musculoskeletal: R knee with bruising and TTP. Skin: no rashes, lesions, ulcers. No induration Neurologic: CN 2-12 grossly intact. Sensation intact, DTR normal. Strength 5/5 in all 4.  Psychiatric: Normal judgment and insight. Alert and oriented x 3. Normal mood.    Labs on Admission: I have personally reviewed following labs and imaging studies  CBC: Recent Labs  Lab 08/23/19 1507  WBC 7.7  NEUTROABS 4.6  HGB 12.4  HCT 37.6  MCV 98.9  PLT 204   Basic Metabolic Panel: Recent Labs  Lab 08/23/19 1507  NA 137  K 3.7  CL 106  CO2 23  GLUCOSE 119*  BUN 24*  CREATININE 0.79  CALCIUM 9.3   GFR: Estimated Creatinine Clearance: 44.2 mL/min (by C-G formula based on SCr of 0.79 mg/dL). Liver Function Tests: No results for input(s): AST, ALT, ALKPHOS, BILITOT, PROT, ALBUMIN in the last 168 hours. No results for input(s): LIPASE, AMYLASE in the last 168 hours. No results for input(s): AMMONIA in the last 168 hours. Coagulation Profile: No results for input(s): INR, PROTIME in the last 168 hours. Cardiac Enzymes: No results for input(s): CKTOTAL, CKMB, CKMBINDEX, TROPONINI in the last 168 hours. BNP (last 3 results) No results for input(s): PROBNP in the last 8760 hours. HbA1C: No results for input(s): HGBA1C in the last 72 hours. CBG: No results for input(s): GLUCAP  in the last 168 hours. Lipid Profile: No results for input(s): CHOL, HDL, LDLCALC, TRIG, CHOLHDL, LDLDIRECT in the last 72 hours. Thyroid Function Tests: No results for input(s): TSH, T4TOTAL, FREET4, T3FREE, THYROIDAB in the last 72 hours. Anemia Panel: No results for input(s): VITAMINB12, FOLATE, FERRITIN, TIBC, IRON, RETICCTPCT in the last 72 hours. Urine analysis: No results found for: COLORURINE, APPEARANCEUR, LABSPEC, PHURINE, GLUCOSEU, HGBUR, BILIRUBINUR, KETONESUR, PROTEINUR, UROBILINOGEN, NITRITE, LEUKOCYTESUR  Radiological Exams on Admission: CT Head Wo Contrast  Result Date: 08/23/2019 CLINICAL DATA:  Head trauma, minor. Additional provided: Fall last Friday night  hitting back of head. EXAM: CT HEAD WITHOUT CONTRAST TECHNIQUE: Contiguous axial images were obtained from the base of the skull through the vertex without intravenous contrast. COMPARISON:  No pertinent prior exams are available for comparison. FINDINGS: Brain: Mild-to-moderate generalized parenchymal atrophy. Mild to moderate ill-defined hypoattenuation within the cerebral white matter which is nonspecific, but consistent with chronic small vessel ischemic disease. There is no acute intracranial hemorrhage. No demarcated cortical infarct. No extra-axial fluid collection. No evidence of intracranial mass. No midline shift. Vascular: No hyperdense vessel.  Atherosclerotic calcifications Skull: Normal. Negative for fracture or focal lesion. Sinuses/Orbits: Visualized orbits show no acute finding. Minimal ethmoid and maxillary sinus mucosal thickening. No significant mastoid effusion. IMPRESSION: No CT evidence of acute intracranial abnormality. Mild-to-moderate generalized parenchymal atrophy and chronic small vessel ischemic disease. Mild paranasal sinus mucosal thickening. Electronically Signed   By: Jackey Loge DO   On: 08/23/2019 17:37   Chest Portable 1 View  Result Date: 08/23/2019 CLINICAL DATA:  Preop for hip surgery  tomorrow. EXAM: PORTABLE CHEST 1 VIEW COMPARISON:  None. FINDINGS: The heart is normal in size. Coronary stent. Aortic atherosclerosis. Ill-defined opacity abutting the right heart border typically seen with epicardial fat pad. Pulmonary vasculature is normal. No consolidation, pleural effusion, or pneumothorax. No acute osseous abnormalities are seen. IMPRESSION: 1. Coronary stent. 2. No acute chest findings. Electronically Signed   By: Narda Rutherford M.D.   On: 08/23/2019 19:53   DG Knee Complete 4 Views Right  Result Date: 08/23/2019 CLINICAL DATA:  Larey Seat, right knee pain and ecchymosis EXAM: RIGHT KNEE - COMPLETE 4+ VIEW COMPARISON:  None. FINDINGS: Frontal, bilateral oblique, lateral views of the right knee are obtained. No acute fracture, subluxation, or dislocation. There is mild 3 compartmental osteoarthritis greatest in the medial compartment. Soft tissue calcification is seen in the infrapatellar fat pad. No joint effusion. IMPRESSION: 1. Three compartmental osteoarthritis. 2. No acute displaced fracture. Electronically Signed   By: Sharlet Salina M.D.   On: 08/23/2019 16:59    EKG: Independently reviewed.  Assessment/Plan Principal Problem:   Closed right hip fracture (HCC) Active Problems:   MCI (mild cognitive impairment)   CAD (coronary artery disease)   HTN (hypertension)    1. R hip fx - 1. By report from pt and ortho surgery, X rays apparently visible in [facility] office, repeat X rays not needed per EDP discussion with Dr. Rennis Chris. 2. Dr. Rennis Chris spoke with Dr. Charlann Boxer who plans for OR repair tomorrow. 3. Hip fx pathway 4. NPO after MN 5. No CP nor DOE nor cardiac symptoms 6. EKG and CXR pending 7. Pain control with PRN fentanyl if needed (severe codeine allergy with throat swelling). 2. MCI - 1. Mild and baseline 3. CAD - 1. Chronic, stable, neg 4. HTN - 1. Cont home BP meds  DVT prophylaxis: SCDs - surg tomorrow Code Status: Full Family Communication: No family  in room Disposition Plan: SNF vs CIR Consults called: Dr. Rennis Chris Admission status: Admit to inpatient  Severity of Illness: The appropriate patient status for this patient is INPATIENT. Inpatient status is judged to be reasonable and necessary in order to provide the required intensity of service to ensure the patient's safety. The patient's presenting symptoms, physical exam findings, and initial radiographic and laboratory data in the context of their chronic comorbidities is felt to place them at high risk for further clinical deterioration. Furthermore, it is not anticipated that the patient will be medically stable for discharge from the hospital within  2 midnights of admission. The following factors support the patient status of inpatient.   IP status for R hip fx operative repair.   * I certify that at the point of admission it is my clinical judgment that the patient will require inpatient hospital care spanning beyond 2 midnights from the point of admission due to high intensity of service, high risk for further deterioration and high frequency of surveillance required.*    Leeum Sankey M. DO [facility]  How to contact the [facility] Attending or Consulting provider 7A - 7P or covering provider during after hours 7P -7A, for this patient?  1. Check the care team in [facility] and look for a) attending/consulting [facility] provider listed and b) the [facility] team listed 2. Log into [email or web address]  Amion Physician Scheduling and messaging for groups and whole hospitals  On call and physician scheduling software for group practices, residents, hospitalists and other medical providers for call, clinic, rotation and shift schedules. On[employer] is a hospital-wide system for scheduling doctors and paging doctors on call. EasyPlot is for scientific plotting and data analysis.  [email or web address]  and use [place]'s universal password to access. If you do not have the password, please contact the  hospital operator.  3. Locate the [facility] provider you are looking for under [facility] and page to a number that you can be directly reached. 4. If you still have difficulty reaching the provider, please page the [facility] (Director on Call) for the Hospitalists listed on amion for assistance.  08/23/2019, 8:02 PM

## 2019-08-24 ENCOUNTER — Encounter (HOSPITAL_COMMUNITY)
Admission: EM | Disposition: A | Discharge status: Home Health | Payer: Self-pay | Source: Home / Self Care | Attending: Internal Medicine

## 2019-08-24 ENCOUNTER — Inpatient Hospital Stay (HOSPITAL_COMMUNITY): Payer: Medicare Other | Admitting: Anesthesiology

## 2019-08-24 ENCOUNTER — Inpatient Hospital Stay (HOSPITAL_COMMUNITY): Payer: Medicare Other

## 2019-08-24 ENCOUNTER — Encounter (HOSPITAL_COMMUNITY): Payer: Self-pay | Admitting: Internal Medicine

## 2019-08-24 HISTORY — PX: TOTAL HIP ARTHROPLASTY: SHX124

## 2019-08-24 LAB — SURGICAL PCR SCREEN
MRSA, PCR: NEGATIVE
Staphylococcus aureus: POSITIVE — AB

## 2019-08-24 LAB — ABO/RH: ABO/RH(D): A POS

## 2019-08-24 SURGERY — ARTHROPLASTY, HIP, TOTAL, ANTERIOR APPROACH
Anesthesia: Monitor Anesthesia Care | Site: Hip | Laterality: Right

## 2019-08-24 MED ORDER — CHLORHEXIDINE GLUCONATE 0.12 % MT SOLN: 15.0000 mL | Freq: Once | OROMUCOSAL | Status: DC

## 2019-08-24 MED ORDER — LIDOCAINE 2% (20 MG/ML) 5 ML SYRINGE
INTRAMUSCULAR | Status: AC
  Filled 2019-08-24: qty 5

## 2019-08-24 MED ORDER — FERROUS SULFATE 325 (65 FE) MG PO TABS
325.0000 mg | ORAL_TABLET | Freq: Three times a day (TID) | ORAL | Status: DC
  Administered 2019-08-25 – 2019-08-26 (×5): 325 mg via ORAL
  Filled 2019-08-24 (×5): qty 1

## 2019-08-24 MED ORDER — ACETAMINOPHEN 10 MG/ML IV SOLN
INTRAVENOUS | Status: AC
  Filled 2019-08-24: qty 100

## 2019-08-24 MED ORDER — FENTANYL CITRATE (PF) 100 MCG/2ML IJ SOLN
INTRAMUSCULAR | Status: AC
  Filled 2019-08-24: qty 2

## 2019-08-24 MED ORDER — ONDANSETRON HCL 4 MG/2ML IJ SOLN: 4.0000 mg | Freq: Four times a day (QID) | INTRAMUSCULAR | Status: DC | PRN

## 2019-08-24 MED ORDER — SODIUM CHLORIDE 0.9 % IV SOLN: INTRAVENOUS | Status: DC

## 2019-08-24 MED ORDER — ACETAMINOPHEN 500 MG PO TABS
1000.0000 mg | ORAL_TABLET | Freq: Once | ORAL | Status: DC
  Filled 2019-08-24: qty 2

## 2019-08-24 MED ORDER — STERILE WATER FOR IRRIGATION IR SOLN
Status: DC | PRN
  Administered 2019-08-24: 2000 mL

## 2019-08-24 MED ORDER — CEFAZOLIN SODIUM-DEXTROSE 2-4 GM/100ML-% IV SOLN
INTRAVENOUS | Status: AC
  Filled 2019-08-24: qty 100

## 2019-08-24 MED ORDER — CHLORHEXIDINE GLUCONATE 4 % EX LIQD: 60.0000 mL | Freq: Once | CUTANEOUS | Status: DC

## 2019-08-24 MED ORDER — ONDANSETRON HCL 4 MG PO TABS: 4.0000 mg | ORAL_TABLET | Freq: Four times a day (QID) | ORAL | Status: DC | PRN

## 2019-08-24 MED ORDER — ONDANSETRON HCL 4 MG/2ML IJ SOLN
INTRAMUSCULAR | Status: DC | PRN
  Administered 2019-08-24: 4 mg via INTRAVENOUS

## 2019-08-24 MED ORDER — PROPOFOL 10 MG/ML IV BOLUS
INTRAVENOUS | Status: DC | PRN
  Administered 2019-08-24 (×2): 20 mg via INTRAVENOUS

## 2019-08-24 MED ORDER — METHOCARBAMOL 1000 MG/10ML IJ SOLN
500.0000 mg | Freq: Four times a day (QID) | INTRAVENOUS | Status: DC | PRN
  Filled 2019-08-24: qty 5

## 2019-08-24 MED ORDER — DEXAMETHASONE SODIUM PHOSPHATE 10 MG/ML IJ SOLN: 8.0000 mg | Freq: Once | INTRAMUSCULAR | Status: DC

## 2019-08-24 MED ORDER — FENTANYL CITRATE (PF) 100 MCG/2ML IJ SOLN
INTRAMUSCULAR | Status: DC | PRN
  Administered 2019-08-24 (×2): 50 ug via INTRAVENOUS

## 2019-08-24 MED ORDER — PROPOFOL 500 MG/50ML IV EMUL
INTRAVENOUS | Status: DC | PRN
  Administered 2019-08-24: 75 ug/kg/min via INTRAVENOUS

## 2019-08-24 MED ORDER — DEXAMETHASONE SODIUM PHOSPHATE 10 MG/ML IJ SOLN
INTRAMUSCULAR | Status: DC | PRN
  Administered 2019-08-24: 4 mg via INTRAVENOUS

## 2019-08-24 MED ORDER — CEFAZOLIN SODIUM-DEXTROSE 2-4 GM/100ML-% IV SOLN
2.0000 g | INTRAVENOUS | Status: AC
  Administered 2019-08-24: 2 g via INTRAVENOUS

## 2019-08-24 MED ORDER — TRANEXAMIC ACID-NACL 1000-0.7 MG/100ML-% IV SOLN
1000.0000 mg | INTRAVENOUS | Status: AC
  Administered 2019-08-24: 1000 mg via INTRAVENOUS

## 2019-08-24 MED ORDER — ONDANSETRON HCL 4 MG/2ML IJ SOLN: 4.0000 mg | Freq: Once | INTRAMUSCULAR | Status: DC | PRN

## 2019-08-24 MED ORDER — METOCLOPRAMIDE HCL 5 MG PO TABS: 5.0000 mg | ORAL_TABLET | Freq: Three times a day (TID) | ORAL | Status: DC | PRN

## 2019-08-24 MED ORDER — MEPIVACAINE HCL 1.5 % IJ SOLN
INTRAMUSCULAR | Status: DC | PRN
  Administered 2019-08-24: 3.5 mL via EPIDURAL

## 2019-08-24 MED ORDER — HYDRALAZINE HCL 20 MG/ML IJ SOLN: 5.0000 mg | Freq: Four times a day (QID) | INTRAMUSCULAR | Status: DC | PRN

## 2019-08-24 MED ORDER — PHENYLEPHRINE HCL-NACL 10-0.9 MG/250ML-% IV SOLN
INTRAVENOUS | Status: DC | PRN
Start: 2019-08-24 — End: 2019-08-24
  Administered 2019-08-24: 25 ug/min via INTRAVENOUS

## 2019-08-24 MED ORDER — FENTANYL CITRATE (PF) 100 MCG/2ML IJ SOLN: 25.0000 ug | INTRAMUSCULAR | Status: DC | PRN

## 2019-08-24 MED ORDER — POLYETHYLENE GLYCOL 3350 17 G PO PACK
17.0000 g | PACK | Freq: Every day | ORAL | Status: DC
  Administered 2019-08-25 – 2019-08-26 (×2): 17 g via ORAL
  Filled 2019-08-24 (×2): qty 1

## 2019-08-24 MED ORDER — 0.9 % SODIUM CHLORIDE (POUR BTL) OPTIME
TOPICAL | Status: DC | PRN
  Administered 2019-08-24: 1000 mL

## 2019-08-24 MED ORDER — CHLORHEXIDINE GLUCONATE CLOTH 2 % EX PADS
6.0000 | MEDICATED_PAD | Freq: Every day | CUTANEOUS | Status: DC
  Administered 2019-08-24: 6 via TOPICAL

## 2019-08-24 MED ORDER — METHOCARBAMOL 500 MG PO TABS
500.0000 mg | ORAL_TABLET | Freq: Four times a day (QID) | ORAL | Status: DC | PRN
  Administered 2019-08-25 – 2019-08-26 (×2): 500 mg via ORAL
  Filled 2019-08-24 (×3): qty 1

## 2019-08-24 MED ORDER — ASPIRIN EC 81 MG PO TBEC
81.0000 mg | DELAYED_RELEASE_TABLET | Freq: Two times a day (BID) | ORAL | Status: DC
  Administered 2019-08-25 – 2019-08-26 (×3): 81 mg via ORAL
  Filled 2019-08-24 (×3): qty 1

## 2019-08-24 MED ORDER — ACETAMINOPHEN 500 MG PO TABS
1000.0000 mg | ORAL_TABLET | Freq: Four times a day (QID) | ORAL | Status: DC
  Administered 2019-08-24 – 2019-08-26 (×6): 1000 mg via ORAL
  Filled 2019-08-24 (×7): qty 2

## 2019-08-24 MED ORDER — MUPIROCIN 2 % EX OINT
1.0000 "application " | TOPICAL_OINTMENT | Freq: Two times a day (BID) | CUTANEOUS | Status: DC
  Administered 2019-08-24 – 2019-08-26 (×6): 1 via NASAL
  Filled 2019-08-24: qty 22

## 2019-08-24 MED ORDER — TRANEXAMIC ACID-NACL 1000-0.7 MG/100ML-% IV SOLN
INTRAVENOUS | Status: AC
  Filled 2019-08-24: qty 100

## 2019-08-24 MED ORDER — METOCLOPRAMIDE HCL 5 MG/ML IJ SOLN: 5.0000 mg | Freq: Three times a day (TID) | INTRAMUSCULAR | Status: DC | PRN

## 2019-08-24 MED ORDER — DOCUSATE SODIUM 100 MG PO CAPS
100.0000 mg | ORAL_CAPSULE | Freq: Two times a day (BID) | ORAL | Status: DC
  Administered 2019-08-24 – 2019-08-26 (×4): 100 mg via ORAL
  Filled 2019-08-24 (×4): qty 1

## 2019-08-24 MED ORDER — CEFAZOLIN SODIUM-DEXTROSE 2-4 GM/100ML-% IV SOLN
2.0000 g | Freq: Four times a day (QID) | INTRAVENOUS | Status: AC
  Administered 2019-08-24 – 2019-08-25 (×2): 2 g via INTRAVENOUS
  Filled 2019-08-24 (×2): qty 100

## 2019-08-24 MED ORDER — LACTATED RINGERS IV SOLN: INTRAVENOUS | Status: DC

## 2019-08-24 MED ORDER — TRAMADOL HCL 50 MG PO TABS
50.0000 mg | ORAL_TABLET | Freq: Four times a day (QID) | ORAL | Status: DC
  Administered 2019-08-24 – 2019-08-25 (×3): 50 mg via ORAL
  Filled 2019-08-24: qty 1
  Filled 2019-08-24: qty 2
  Filled 2019-08-24: qty 1

## 2019-08-24 MED ORDER — POVIDONE-IODINE 10 % EX SWAB
2.0000 "application " | Freq: Once | CUTANEOUS | Status: AC
  Administered 2019-08-24: 2 via TOPICAL

## 2019-08-24 MED ORDER — ADULT MULTIVITAMIN W/MINERALS CH
1.0000 | ORAL_TABLET | Freq: Every day | ORAL | Status: DC
  Administered 2019-08-25 – 2019-08-26 (×2): 1 via ORAL
  Filled 2019-08-24 (×2): qty 1

## 2019-08-24 MED ORDER — ACETAMINOPHEN 10 MG/ML IV SOLN
INTRAVENOUS | Status: DC | PRN
Start: 2019-08-24 — End: 2019-08-24
  Administered 2019-08-24: 650 mg via INTRAVENOUS

## 2019-08-24 MED ORDER — ACETAMINOPHEN 325 MG PO TABS
650.0000 mg | ORAL_TABLET | Freq: Four times a day (QID) | ORAL | Status: DC | PRN
  Administered 2019-08-24: 650 mg via ORAL
  Filled 2019-08-24: qty 2

## 2019-08-24 SURGICAL SUPPLY — 47 items
BAG DECANTER FOR FLEXI CONT (MISCELLANEOUS) IMPLANT
BAG ZIPLOCK 12X15 (MISCELLANEOUS) IMPLANT
BLADE SAG 18X100X1.27 (BLADE) ×3 IMPLANT
BLADE SURG SZ10 CARB STEEL (BLADE) ×6 IMPLANT
COVER PERINEAL POST (MISCELLANEOUS) ×3 IMPLANT
COVER SURGICAL LIGHT HANDLE (MISCELLANEOUS) ×3 IMPLANT
COVER WAND RF STERILE (DRAPES) IMPLANT
CUP ACETBLR 52 OD PINNACLE (Hips) ×2 IMPLANT
DERMABOND ADVANCED (GAUZE/BANDAGES/DRESSINGS) ×2
DERMABOND ADVANCED .7 DNX12 (GAUZE/BANDAGES/DRESSINGS) ×1 IMPLANT
DRAPE STERI IOBAN 125X83 (DRAPES) ×3 IMPLANT
DRAPE U-SHAPE 47X51 STRL (DRAPES) ×6 IMPLANT
DRESSING AQUACEL AG SP 3.5X10 (GAUZE/BANDAGES/DRESSINGS) ×1 IMPLANT
DRSG AQUACEL AG ADV 3.5X10 (GAUZE/BANDAGES/DRESSINGS) ×2 IMPLANT
DRSG AQUACEL AG SP 3.5X10 (GAUZE/BANDAGES/DRESSINGS) ×3
DURAPREP 26ML APPLICATOR (WOUND CARE) ×3 IMPLANT
ELECT REM PT RETURN 15FT ADLT (MISCELLANEOUS) ×3 IMPLANT
ELIMINATOR HOLE APEX DEPUY (Hips) ×2 IMPLANT
GLOVE BIO SURGEON STRL SZ 6 (GLOVE) ×6 IMPLANT
GLOVE BIOGEL PI IND STRL 6.5 (GLOVE) ×1 IMPLANT
GLOVE BIOGEL PI IND STRL 7.5 (GLOVE) ×1 IMPLANT
GLOVE BIOGEL PI IND STRL 8.5 (GLOVE) ×1 IMPLANT
GLOVE BIOGEL PI INDICATOR 6.5 (GLOVE) ×2
GLOVE BIOGEL PI INDICATOR 7.5 (GLOVE) ×2
GLOVE BIOGEL PI INDICATOR 8.5 (GLOVE) ×2
GLOVE ECLIPSE 8.0 STRL XLNG CF (GLOVE) ×6 IMPLANT
GLOVE ORTHO TXT STRL SZ7.5 (GLOVE) ×6 IMPLANT
GOWN STRL REUS W/TWL LRG LVL3 (GOWN DISPOSABLE) ×6 IMPLANT
GOWN STRL REUS W/TWL XL LVL3 (GOWN DISPOSABLE) ×3 IMPLANT
HEAD M SROM 36MM PLUS 1.5 (Hips) IMPLANT
HOLDER FOLEY CATH W/STRAP (MISCELLANEOUS) ×3 IMPLANT
KIT TURNOVER KIT A (KITS) IMPLANT
LINER NEUTRAL 52X36MM PLUS 4 (Liner) ×2 IMPLANT
PACK ANTERIOR HIP CUSTOM (KITS) ×3 IMPLANT
PENCIL SMOKE EVACUATOR (MISCELLANEOUS) IMPLANT
SCREW 6.5MMX30MM (Screw) ×2 IMPLANT
SROM M HEAD 36MM PLUS 1.5 (Hips) ×3 IMPLANT
STEM FEM ACTIS HIGH SZ3 (Stem) ×2 IMPLANT
SUT MNCRL AB 4-0 PS2 18 (SUTURE) ×3 IMPLANT
SUT STRATAFIX 0 PDS 27 VIOLET (SUTURE) ×3
SUT VIC AB 1 CT1 36 (SUTURE) ×9 IMPLANT
SUT VIC AB 2-0 CT1 27 (SUTURE) ×4
SUT VIC AB 2-0 CT1 TAPERPNT 27 (SUTURE) ×2 IMPLANT
SUTURE STRATFX 0 PDS 27 VIOLET (SUTURE) ×1 IMPLANT
TRAY FOLEY MTR SLVR 16FR STAT (SET/KITS/TRAYS/PACK) IMPLANT
WATER STERILE IRR 1000ML POUR (IV SOLUTION) ×3 IMPLANT
YANKAUER SUCT BULB TIP 10FT TU (MISCELLANEOUS) IMPLANT

## 2019-08-24 NOTE — Anesthesia Postprocedure Evaluation (Signed)
Anesthesia Post Note  Patient: Cesiah Westley  Procedure(s) Performed: TOTAL HIP ARTHROPLASTY ANTERIOR APPROACH (Right Hip)     Patient location during evaluation: PACU Anesthesia Type: MAC and Spinal Level of consciousness: oriented and awake and alert Pain management: pain level controlled Vital Signs Assessment: post-procedure vital signs reviewed and stable Respiratory status: spontaneous breathing and respiratory function stable Cardiovascular status: blood pressure returned to baseline and stable Postop Assessment: no headache, no backache, no apparent nausea or vomiting and spinal receding Anesthetic complications: no   No complications documented.  Last Vitals:  Vitals:   08/24/19 1830 08/24/19 1840  BP: 116/61 (!) 158/86  Pulse: (!) 53 (!) 58  Resp: 14 17  Temp: 36.4 C (!) 36.4 C  SpO2: 100% 100%    Last Pain:  Vitals:   08/24/19 1840  TempSrc: Oral  PainSc:     LLE Motor Response: Purposeful movement (08/24/19 1849) LLE Sensation: Decreased;Numbness (08/24/19 1849) RLE Motor Response: Purposeful movement (08/24/19 1849) RLE Sensation: Decreased;Numbness (08/24/19 1849) L Sensory Level: S1-Sole of foot, small toes (08/24/19 1849) R Sensory Level: L5-Outer lower leg, top of foot, great toe (08/24/19 1849)  Pervis Hocking

## 2019-08-24 NOTE — Interval H&P Note (Signed)
History and Physical Interval Note:  08/24/2019 12:54 PM  Abigail Fowler  has presented today for surgery, with the diagnosis of fractured right hip.  The various methods of treatment have been discussed with the patient and family. After consideration of risks, benefits and other options for treatment, the patient has consented to  Procedure(s): TOTAL HIP ARTHROPLASTY ANTERIOR APPROACH (Right) as a surgical intervention.  The patient's history has been reviewed, patient examined, no change in status, stable for surgery.  I have reviewed the patient's chart and labs.  Questions were answered to the patient's satisfaction.     [name]

## 2019-08-24 NOTE — Anesthesia Procedure Notes (Signed)
Spinal  Patient location during procedure: OR Start time: 08/24/2019 2:55 PM End time: 08/24/2019 3:03 PM Staffing Performed: anesthesiologist  Anesthesiologist: Lannie Fields, DO Preanesthetic Checklist Completed: patient identified, IV checked, risks and benefits discussed, surgical consent, monitors and equipment checked, pre-op evaluation and timeout performed Spinal Block Patient position: sitting Prep: DuraPrep and site prepped and draped Patient monitoring: cardiac monitor, continuous pulse ox and blood pressure Approach: midline Location: L3-4 Injection technique: single-shot Needle Needle type: Pencan  Needle gauge: 24 G Needle length: 9 cm Assessment Sensory level: T6 Additional Notes Functioning IV was confirmed and monitors were applied. Sterile prep and drape, including hand hygiene and sterile gloves were used. The patient was positioned and the spine was prepped. The skin was anesthetized with lidocaine.  Free flow of clear CSF was obtained prior to injecting local anesthetic into the CSF.  The spinal needle aspirated freely following injection.  The needle was carefully withdrawn.  The patient tolerated the procedure well.

## 2019-08-24 NOTE — Op Note (Signed)
NAME:  Abigail Fowler                ACCOUNT NO.: [id number]      MEDICAL RECORD NO.: [id number]      FACILITY:  [facility]      PHYSICIAN:  [name]  DATE OF BIRTH:  17-Mar-1935     DATE OF PROCEDURE:  08/24/2019                                 OPERATIVE REPORT         PREOPERATIVE DIAGNOSIS: Right hip femoral neck fracture.      POSTOPERATIVE DIAGNOSIS:  Right hip femoral neck fracture.      PROCEDURE:  Right total hip replacement through an anterior approach   utilizing DePuy THR system, component size 52 mm pinnacle cup, a size 36+4 neutral   Altrex liner, a size 3 Hi Actis stem with a 36+1.5 Articuleze metal head ball.      SURGEON:  Madlyn Frankel. Charlann Boxer, M.D.      ASSISTANT:  Dennie Bible, PA-C     ANESTHESIA:  Spinal.      SPECIMENS:  None.      COMPLICATIONS:  None.      BLOOD LOSS:  350 cc     DRAINS:  None.      INDICATION OF THE PROCEDURE:  Abigail Fowler is a 84 y.o. female who fell this past Friday.  Due to increasing pain she was initially seen in our office where radiographs revealed a femoral neck fracture.  She was transferred to the ER and admitted to the Hospitalist service per protocol.  We reviewed treatment options and have decide to proceed with total hip replacement. Consent was obtained for   benefit of pain relief.  Specific risks of infection, DVT, component   failure, dislocation, neurovascular injury, and need for revision surgery were reviewed in the office as well discussion of   the anterior versus posterior approach were reviewed.     PROCEDURE IN DETAIL:  The patient was brought to operative theater.   Once adequate anesthesia, preoperative antibiotics, 2 gm of Ancef, 1 gm of Tranexamic Acid, and 10 mg of Decadron were administered, the patient was positioned supine on the [employer] table.  Once the patient was safely positioned with adequate padding of boney prominences we predraped out the hip, and used fluoroscopy to confirm  orientation of the pelvis.      The right hip was then prepped and draped from proximal iliac crest to   mid thigh with a shower curtain technique.      Time-out was performed identifying the patient, planned procedure, and the appropriate extremity.     An incision was then made 2 cm lateral to the   anterior superior iliac spine extending over the orientation of the   tensor fascia lata muscle and sharp dissection was carried down to the   fascia of the muscle.      The fascia was then incised.  The muscle belly was identified and swept   laterally and retractor placed along the superior neck.  Following   cauterization of the circumflex vessels and removing some pericapsular   fat, a second cobra retractor was placed on the inferior neck.  A T-capsulotomy was made along the line of the   superior neck to the trochanteric fossa, then extended proximally and   distally.  Tag sutures were placed and the retractors were then placed   intracapsular.  We then identified the trochanteric fossa and   orientation of my neck cut and then made a neck osteotomy with the femur on traction.  The femoral   head was removed without difficulty or complication.  Traction was let   off and retractors were placed posterior and anterior around the   acetabulum.      The labrum and foveal tissue were debrided.  I began reaming with a 44 mm   reamer and reamed up to 51 mm reamer with good bony bed preparation and a 52 mm  cup was chosen.  The final 52 mm Pinnacle cup was then impacted under fluoroscopy to confirm the depth of penetration and orientation with respect to   Abduction and forward flexion.  A screw was placed into the ilium followed by the hole eliminator.  The final   36+4 neutral Altrex liner was impacted with good visualized rim fit.  The cup was positioned anatomically within the acetabular portion of the pelvis.      At this point, the femur was rolled to 100 degrees.  Further capsule was    released off the inferior aspect of the femoral neck.  I then   released the superior capsule proximally.  With the leg in a neutral position the hook was placed laterally   along the femur under the vastus lateralis origin and elevated manually and then held in position using the hook attachment on the bed.  The leg was then extended and adducted with the leg rolled to 100   degrees of external rotation.  Retractors were placed along the medial calcar and posteriorly over the greater trochanter.  Once the proximal femur was fully   exposed, I used a box osteotome to set orientation.  I then began   broaching with the starting chili pepper broach and passed this by hand and then broached up to 3.  With the 3 broach in place I chose a high offset neck and did several trial reductions.  The offset was appropriate, leg lengths   appeared to be equal best matched with the +1.5 head ball trial confirmed radiographically.   Given these findings, I went ahead and dislocated the hip, repositioned all   retractors and positioned the right hip in the extended and abducted position.  The final 3 Hi Actis stem was   chosen and it was impacted down to the level of neck cut.  Based on this   and the trial reductions, a final 36+1.5 Articuleze metal head ball was chosen and   impacted onto a clean and dry trunnion, and the hip was reduced.  The   hip had been irrigated throughout the case again at this point.  I did   reapproximate the superior capsular leaflet to the anterior leaflet   using #1 Vicryl.  The fascia of the   tensor fascia lata muscle was then reapproximated using #1 Vicryl and #0 Stratafix sutures.  The   remaining wound was closed with 2-0 Vicryl and running 4-0 Monocryl.   The hip was cleaned, dried, and dressed sterilely using Dermabond and   Aquacel dressing.  The patient was then brought   to recovery room in stable condition tolerating the procedure well.    Dennie Bible, PA-C was  present for the entirety of the case involved from   preoperative positioning, perioperative retractor management, general   facilitation of the case,  as well as primary wound closure as assistant.            Madlyn Frankel Charlann Boxer, M.D.        08/24/2019 3:21 PM

## 2019-08-24 NOTE — Progress Notes (Signed)
Initial Nutrition Assessment  RD working remotely.  DOCUMENTATION CODES:   Not applicable  INTERVENTION:  - will order 1 tablet multivitamin with minerals/day. - diet advancement as medically feasible. - will order appropriate oral nutrition supplements following diet advancement.  NUTRITION DIAGNOSIS:   Increased nutrient needs related to post-op healing as evidenced by estimated needs.  GOAL:   Patient will meet greater than or equal to 90% of their needs  MONITOR:   Diet advancement, Labs, Weight trends  REASON FOR ASSESSMENT:   Malnutrition Screening Tool, Consult Hip fracture protocol  ASSESSMENT:   84 y.o. female with medical history of HLD, HTN, CAD, and Alzheimer's dementia. She presented to the ED after a fall at home on 8/13. She continued to walk over the weekend and went to [facility] on 8/16 at which time she was sent to the hospital d/t R hip fx. She did hit her head during fall and has a small wound on her R scalp.  She was ordered a Heart Healthy diet yesterday at 1930 and then made NPO at midnight. Plan for today is for R total hip replacement. Increased estimated needs in anticipation of this surgery. She is noted to be a/o to self and place.  Weight yesterday was 134 lb and PTA the most recently documented weight was on 04/17/15 when she weighed 184 lb.   MST documentation shows patient reported she has lost "a bunch of weight" but no other details available.    Labs reviewed; BUN: 24 mg/dl. Medications reviewed; 17 g miralax/day.  IVF; NS @ 100 ml/hr.     NUTRITION - FOCUSED PHYSICAL EXAM:  unable to complete at this time.   Diet Order:   Diet Order            Diet NPO time specified Except for: Sips with Meds  Diet effective now                 EDUCATION NEEDS:   No education needs have been identified at this time  Skin:  Skin Assessment: Reviewed RN Assessment  Last BM:  PTA/unknown  Height:   Ht Readings from Last 1  Encounters:  08/23/19 5' (1.524 m)    Weight:   Wt Readings from Last 1 Encounters:  08/23/19 60.8 kg    Estimated Nutritional Needs:  Kcal:  6578-4696 kcal Protein:  70-80 grams Fluid:  >/= 1.7 L/day     Trenton Gammon, MS, RD, LDN, CNSC Inpatient Clinical Dietitian RD pager # available in [facility]  After hours/weekend pager # available in [facility]

## 2019-08-24 NOTE — Progress Notes (Signed)
PROGRESS NOTE    Abigail Fowler  EXH:[id number] DOB: February 25, 1935 DOA: 08/23/2019 PCP: Britta Mccreedy, MD    Brief Narrative: 84 y.o. female with medical history significant of MCI, HTN, CAD s/p DES to LAD in 2007, normal EF and normal stress echo in 2016. Patient had a mechanical fall Friday at home.  She continued to walk on that leg over the weekend.  She went to [facility] office on Monday and they sent her to the hospital with right hip fracture. Pt sent in from [facility] office to ED for admission, plan is for Dr. Charlann Boxer to do surgery tomorrow.  Did hit head with fall, small wound to R scalp.  ED Course: CT head neg.   Assessment & Plan:   Principal Problem:   Closed right hip fracture (HCC) Active Problems:   MCI (mild cognitive impairment)   CAD (coronary artery disease)   HTN (hypertension)   #1 right femoral neck hip fracture status post mechanical fall-patient scheduled for right total hip replacement 08/24/2019. Patient is n.p.o. for surgery Start IV fluids Preop EKG normal sinus rhythm chest x-ray no acute findings. Add as needed stool softeners Tylenol 3 times a day DVT prophylaxis per Ortho  #2 history of Alzheimer's dementia/depression only on Zoloft at home  #3 history of CAD/stent in 2007 EKG sinus rhythm stable no active chest pain shortness of breath reported  #4 history of hyperlipidemia on Lipitor 20 mg daily  #5 history of essential hypertension blood pressure 160/78 she is not on any medications at home.  We will put her on as needed hydralazine.    Estimated body mass index is 26.17 kg/m as calculated from the following:   Height as of this encounter: 5' (1.524 m).   Weight as of this encounter: 60.8 kg.  DVT prophylaxis: SCD patient scheduled for surgery today Code Status: Full code Family Communication: Discussed with daughter Renee Harder Disposition Plan:  Status is: Inpatient   Dispo: The patient is from: Home              Anticipated  d/c is to: SNF              Anticipated d/c date is: More than 3 days              Patient currently is not medically stable to d/c.   Consultants: Ortho  Procedures: None Antimicrobials: None Subjective:  Patient resting in bed complaining of a headache Denies any chest pain shortness of breath nausea vomiting Objective: Vitals:   08/23/19 2121 08/24/19 0210 08/24/19 0500 08/24/19 0935  BP: (!) 143/78 (!) 160/88 120/88 (!) 160/78  Pulse: 74 65 72 69  Resp: 16 16 16 16   Temp: 98 F (36.7 C) 98.6 F (37 C) 97.8 F (36.6 C) 98.1 F (36.7 C)  TempSrc: Oral Oral Oral Oral  SpO2: 100% 100% 99% 100%  Weight:      Height:        Intake/Output Summary (Last 24 hours) at 08/24/2019 1033 Last data filed at 08/24/2019 1000 Gross per 24 hour  Intake 155.64 ml  Output 250 ml  Net -94.36 ml   Filed Weights   08/23/19 1448  Weight: 60.8 kg    Examination:  General exam: Appears calm and comfortable  Respiratory system: Clear to auscultation. Respiratory effort normal. Cardiovascular system: S1 & S2 heard, RRR. No JVD, murmurs, rubs, gallops or clicks. No pedal edema. Gastrointestinal system: Abdomen is nondistended, soft and nontender. No organomegaly or masses felt.  Normal bowel sounds heard. Central nervous system: Alert and oriented. No focal neurological deficits. Extremities: No edema Skin: No rashes, lesions or ulcers Psychiatry: Judgement and insight appear normal. Mood & affect appropriate.     Data Reviewed: I have personally reviewed following labs and imaging studies  CBC: Recent Labs  Lab 08/23/19 1507  WBC 7.7  NEUTROABS 4.6  HGB 12.4  HCT 37.6  MCV 98.9  PLT 204   Basic Metabolic Panel: Recent Labs  Lab 08/23/19 1507  NA 137  K 3.7  CL 106  CO2 23  GLUCOSE 119*  BUN 24*  CREATININE 0.79  CALCIUM 9.3   GFR: Estimated Creatinine Clearance: 44.2 mL/min (by C-G formula based on SCr of 0.79 mg/dL). Liver Function Tests: No results for  input(s): AST, ALT, ALKPHOS, BILITOT, PROT, ALBUMIN in the last 168 hours. No results for input(s): LIPASE, AMYLASE in the last 168 hours. No results for input(s): AMMONIA in the last 168 hours. Coagulation Profile: No results for input(s): INR, PROTIME in the last 168 hours. Cardiac Enzymes: No results for input(s): CKTOTAL, CKMB, CKMBINDEX, TROPONINI in the last 168 hours. BNP (last 3 results) No results for input(s): PROBNP in the last 8760 hours. HbA1C: No results for input(s): HGBA1C in the last 72 hours. CBG: No results for input(s): GLUCAP in the last 168 hours. Lipid Profile: No results for input(s): CHOL, HDL, LDLCALC, TRIG, CHOLHDL, LDLDIRECT in the last 72 hours. Thyroid Function Tests: No results for input(s): TSH, T4TOTAL, FREET4, T3FREE, THYROIDAB in the last 72 hours. Anemia Panel: No results for input(s): VITAMINB12, FOLATE, FERRITIN, TIBC, IRON, RETICCTPCT in the last 72 hours. Sepsis Labs: No results for input(s): PROCALCITON, LATICACIDVEN in the last 168 hours.  Recent Results (from the past 240 hour(s))  SARS Coronavirus 2 by RT PCR (hospital order, performed in [facility] hospital lab) Nasopharyngeal Nasopharyngeal Swab     Status: None   Collection Time: 08/23/19  3:12 PM   Specimen: Nasopharyngeal Swab  Result Value Ref Range Status   SARS Coronavirus 2 NEGATIVE NEGATIVE Final    Comment: (NOTE) SARS-CoV-2 target nucleic acids are NOT DETECTED.  The SARS-CoV-2 RNA is generally detectable in upper and lower respiratory specimens during the acute phase of infection. The lowest concentration of SARS-CoV-2 viral copies this assay can detect is 250 copies / mL. A negative result does not preclude SARS-CoV-2 infection and should not be used as the sole basis for treatment or other patient management decisions.  A negative result may occur with improper specimen collection / handling, submission of specimen other than nasopharyngeal swab, presence of viral  mutation(s) within the areas targeted by this assay, and inadequate number of viral copies (<250 copies / mL). A negative result must be combined with clinical observations, patient history, and epidemiological information.  Fact Sheet for Patients:   BoilerBrush.com.cy  Fact Sheet for Healthcare Providers: https://pope.com/  This test is not yet approved or  cleared by the [place] FDA and has been authorized for detection and/or diagnosis of SARS-CoV-2 by FDA under an Emergency Use Authorization (EUA).  This EUA will remain in effect (meaning this test can be used) for the duration of the COVID-19 declaration under Section 564(b)(1) of the Act, 21 U.S.C. section 360bbb-3(b)(1), unless the authorization is terminated or revoked sooner.  Performed at [facility], [address], [place], [place] [address]   Surgical pcr screen     Status: Abnormal   Collection Time: 08/23/19 11:52 PM   Specimen:  Nasal Mucosa; Nasal Swab  Result Value Ref Range Status   MRSA, PCR NEGATIVE NEGATIVE Final   Staphylococcus aureus POSITIVE (A) NEGATIVE Final    Comment: (NOTE) The Xpert SA Assay (FDA approved for NASAL specimens in patients 75 years of age and older), is one component of a comprehensive surveillance program. It is not intended to diagnose infection nor to guide or monitor treatment. Performed at [facility], [address], [place], [place] [address]          Radiology Studies: CT Head Wo Contrast  Result Date: 08/23/2019 CLINICAL DATA:  Head trauma, minor. Additional provided: Fall last Friday night hitting back of head. EXAM: CT HEAD WITHOUT CONTRAST TECHNIQUE: Contiguous axial images were obtained from the base of the skull through the vertex without intravenous contrast. COMPARISON:  No pertinent prior exams are available for comparison. FINDINGS: Brain: Mild-to-moderate generalized  parenchymal atrophy. Mild to moderate ill-defined hypoattenuation within the cerebral white matter which is nonspecific, but consistent with chronic small vessel ischemic disease. There is no acute intracranial hemorrhage. No demarcated cortical infarct. No extra-axial fluid collection. No evidence of intracranial mass. No midline shift. Vascular: No hyperdense vessel.  Atherosclerotic calcifications Skull: Normal. Negative for fracture or focal lesion. Sinuses/Orbits: Visualized orbits show no acute finding. Minimal ethmoid and maxillary sinus mucosal thickening. No significant mastoid effusion. IMPRESSION: No CT evidence of acute intracranial abnormality. Mild-to-moderate generalized parenchymal atrophy and chronic small vessel ischemic disease. Mild paranasal sinus mucosal thickening. Electronically Signed   By: Jackey Loge DO   On: 08/23/2019 17:37   Chest Portable 1 View  Result Date: 08/23/2019 CLINICAL DATA:  Preop for hip surgery tomorrow. EXAM: PORTABLE CHEST 1 VIEW COMPARISON:  None. FINDINGS: The heart is normal in size. Coronary stent. Aortic atherosclerosis. Ill-defined opacity abutting the right heart border typically seen with epicardial fat pad. Pulmonary vasculature is normal. No consolidation, pleural effusion, or pneumothorax. No acute osseous abnormalities are seen. IMPRESSION: 1. Coronary stent. 2. No acute chest findings. Electronically Signed   By: Narda Rutherford M.D.   On: 08/23/2019 19:53   DG Knee Complete 4 Views Right  Result Date: 08/23/2019 CLINICAL DATA:  Larey Seat, right knee pain and ecchymosis EXAM: RIGHT KNEE - COMPLETE 4+ VIEW COMPARISON:  None. FINDINGS: Frontal, bilateral oblique, lateral views of the right knee are obtained. No acute fracture, subluxation, or dislocation. There is mild 3 compartmental osteoarthritis greatest in the medial compartment. Soft tissue calcification is seen in the infrapatellar fat pad. No joint effusion. IMPRESSION: 1. Three compartmental  osteoarthritis. 2. No acute displaced fracture. Electronically Signed   By: Sharlet Salina M.D.   On: 08/23/2019 16:59        Scheduled Meds: . atorvastatin  20 mg Oral Daily  . Chlorhexidine Gluconate Cloth  6 each Topical Daily  . mupirocin ointment  1 application Nasal BID  . sertraline  50 mg Oral Daily   Continuous Infusions: . sodium chloride 100 mL/hr at 08/24/19 0902     LOS: 1 day     Alwyn Ren, MD  08/24/2019, 10:33 AM

## 2019-08-24 NOTE — Progress Notes (Signed)
Informed consent authorized by Renee Harder, the pt's Daughter d/t the pt's hx of dementia. Alfonzo Beers, RN witnessed the consent via phone call.

## 2019-08-24 NOTE — Transfer of Care (Signed)
Immediate Anesthesia Transfer of Care Note  Patient: Abigail Fowler  Procedure(s) Performed: TOTAL HIP ARTHROPLASTY ANTERIOR APPROACH (Right Hip)  Patient Location: PACU  Anesthesia Type:Spinal  Level of Consciousness: awake, alert  and drowsy  Airway & Oxygen Therapy: Patient Spontanous Breathing and Patient connected to face mask  Post-op Assessment: Report given to RN and Post -op Vital signs reviewed and stable  Post vital signs: Reviewed and stable  Last Vitals:  Vitals Value Taken Time  BP    Temp 36.4 C 08/24/19 1633  Pulse 66 08/24/19 1634  Resp 11 08/24/19 1634  SpO2 98 % 08/24/19 1634  Vitals shown include unvalidated device data.  Last Pain:  Vitals:   08/24/19 1302  TempSrc: Oral  PainSc:          Complications: No complications documented.

## 2019-08-24 NOTE — Anesthesia Preprocedure Evaluation (Addendum)
Anesthesia Evaluation  Patient identified by MRN, date of birth, ID band Patient awake    Reviewed: Allergy & Precautions, NPO status , Patient's Chart, lab work & pertinent test results  Airway Mallampati: I  TM Distance: >3 FB Neck ROM: Full    Dental  (+) Edentulous Upper,    Pulmonary neg pulmonary ROS,    Pulmonary exam normal breath sounds clear to auscultation       Cardiovascular hypertension, + CAD, + Past MI and + Cardiac Stents (DES to LAD in 2007)  Normal cardiovascular exam Rhythm:Regular Rate:Normal  nml stress test 2016  HTN- was taken off antihypertensives per patient   Neuro/Psych PSYCHIATRIC DISORDERS Depression Dementia alzheimers negative neurological ROS     GI/Hepatic negative GI ROS, Neg liver ROS,   Endo/Other  negative endocrine ROS  Renal/GU negative Renal ROS  negative genitourinary   Musculoskeletal negative musculoskeletal ROS (+)   Abdominal   Peds  Hematology negative hematology ROS (+) hct 37.6, plt 204   Anesthesia Other Findings   Reproductive/Obstetrics negative OB ROS                            Anesthesia Physical Anesthesia Plan  ASA: III  Anesthesia Plan: Spinal and MAC   Post-op Pain Management:    Induction:   PONV Risk Score and Plan: 2 and Propofol infusion and TIVA  Airway Management Planned: Natural Airway and Nasal Cannula  Additional Equipment: None  Intra-op Plan:   Post-operative Plan:   Informed Consent: I have reviewed the patients History and Physical, chart, labs and discussed the procedure including the risks, benefits and alternatives for the proposed anesthesia with the patient or authorized representative who has indicated his/her understanding and acceptance.       Plan Discussed with: CRNA  Anesthesia Plan Comments:        Anesthesia Quick Evaluation

## 2019-08-24 NOTE — Plan of Care (Signed)
Plan of care initiated.

## 2019-08-25 ENCOUNTER — Encounter (HOSPITAL_COMMUNITY): Payer: Self-pay | Admitting: Orthopedic Surgery

## 2019-08-25 LAB — BASIC METABOLIC PANEL
Anion gap: 11 (ref 5–15)
BUN: 20 mg/dL (ref 8–23)
CO2: 20 mmol/L — ABNORMAL LOW (ref 22–32)
Calcium: 8.3 mg/dL — ABNORMAL LOW (ref 8.9–10.3)
Chloride: 106 mmol/L (ref 98–111)
Creatinine, Ser: 0.78 mg/dL (ref 0.44–1.00)
GFR calc Af Amer: >60 mL/min (ref >60)
GFR calc non Af Amer: >60 mL/min (ref >60)
Glucose, Bld: 153 mg/dL — ABNORMAL HIGH (ref 70–99)
Potassium: 3.8 mmol/L (ref 3.5–5.1)
Sodium: 137 mmol/L (ref 135–145)

## 2019-08-25 LAB — CBC
HCT: 32.1 % — ABNORMAL LOW (ref 36.0–46.0)
Hemoglobin: 10.7 g/dL — ABNORMAL LOW (ref 12.0–15.0)
MCH: 33.1 pg (ref 26.0–34.0)
MCHC: 33.3 g/dL (ref 30.0–36.0)
MCV: 99.4 fL (ref 80.0–100.0)
Platelets: 166 10*3/uL (ref 150–400)
RBC: 3.23 MIL/uL — ABNORMAL LOW (ref 3.87–5.11)
RDW: 12.4 % (ref 11.5–15.5)
WBC: 6.8 10*3/uL (ref 4.0–10.5)
nRBC: 0 % (ref 0.0–0.2)

## 2019-08-25 MED ORDER — ASPIRIN 81 MG PO CHEW: 81.0000 mg | CHEWABLE_TABLET | Freq: Two times a day (BID) | ORAL | 0 refills | Status: AC

## 2019-08-25 MED ORDER — TRAMADOL HCL 50 MG PO TABS: 50.0000 mg | ORAL_TABLET | Freq: Four times a day (QID) | ORAL | 0 refills | Status: AC | PRN

## 2019-08-25 MED ORDER — TRAMADOL HCL 50 MG PO TABS
50.0000 mg | ORAL_TABLET | Freq: Four times a day (QID) | ORAL | Status: DC | PRN
  Administered 2019-08-25 – 2019-08-26 (×2): 50 mg via ORAL
  Filled 2019-08-25 (×2): qty 1

## 2019-08-25 NOTE — Progress Notes (Signed)
Occupational Therapy Evaluation  Patient with functional deficits listed below impacting safety and independence with self care. Spoke with DTR on phone, reports patient is independent with self care except bathing and tub transfer, DTR also supervises patient on stairs (bedrooms on second level). Patient has walker but doesn't use in house typically. Currently patient min A with functional  Transfers and ambulation with walker with mod cues for safety managing walker at sink for grooming/hygiene. DTR expresses would like to bring patient home, currently with the 24/7 support available from DTR recommend [facility] services at this time.    08/25/19 1200  OT Visit Information  Last OT Received On 08/25/19  Assistance Needed +1  History of Present Illness 84 y.o. female with medical history significant of MCI, HTN, CAD s/p DES to LAD in 2007, Alzheimers dementia and admitted with right femoral neck hip fracture.  Pt s/p right direct anterior THA 08/24/19  Precautions  Precautions Fall  Restrictions  Weight Bearing Restrictions Yes  RLE Weight Bearing WBAT  Home Living  Family/patient expects to be discharged to: Private residence  Living Arrangements Children  Available Help at Discharge Family;Available 24 hours/day  Type of Home House  Home Access Stairs to enter  Entrance Stairs-Number of Steps 1 small step  Home Layout Two level;Bed/bath upstairs  Alternate Level Stairs-Number of Steps full flight  Bathroom Shower/Tub Tub/shower unit  Engineer, water - 2 wheels  Additional Comments spoke with patient's DTR, since COVID moved patient into DTRs home  Prior Function  Level of Independence Needs assistance  Gait / Transfers Assistance Needed does not use walker in DTR's house, for community distances/outdoor primarily   ADL's / Homemaking Assistance Needed DTR assists with tub transfer/bathing, otherwise patient independent with dressing, toileting, eating, fixed  herself sandwich in kitchen  Communication  Communication No difficulties  Pain Assessment  Pain Assessment Faces  Faces Pain Scale 2  Pain Location right hip with movement  Pain Descriptors / Indicators Sore  Pain Intervention(s) RN gave pain meds during session  Cognition  Arousal/Alertness Awake/alert  Behavior During Therapy [facility] for tasks assessed/performed  Overall Cognitive Status History of cognitive impairments - at baseline  General Comments pleasant and follows simple commands, hx dementia  Upper Extremity Assessment  Upper Extremity Assessment Overall WFL for tasks assessed  Lower Extremity Assessment  Lower Extremity Assessment Defer to PT evaluation  ADL  Overall ADL's  Needs assistance/impaired  Eating/Feeding Set up;Sitting  Eating/Feeding Details (indicate cue type and reason) open containers  Grooming Wash/dry hands;Min guard;Standing  Upper Body Bathing Set up;Sitting  Lower Body Bathing Moderate assistance;Sitting/lateral leans;Sit to/from stand  Upper Body Dressing  Set up;Sitting  Lower Body Dressing Moderate assistance;Sitting/lateral leans;Sit to/from Scientist, research (life sciences) Minimal assistance;Ambulation;Cueing for safety;Regular Toilet;RW  Toilet Transfer Details (indicate cue type and reason) ambulated into bathroom to wash hands, min A for safety with managing FWW at sink  Toileting- Clothing Manipulation and Hygiene Minimal assistance;Sitting/lateral lean;Sit to/from stand  Functional mobility during ADLs Minimal assistance;Cueing for safety;Cueing for sequencing;Rolling walker  General ADL Comments patient requiring increased assistance with self care due to pain, decreased balance, safety awareness   Bed Mobility  General bed mobility comments in recliner  Transfers  Overall transfer level Needs assistance  Equipment used Rolling walker (2 wheeled)  Transfers Sit to/from Stand  Sit to Stand Min assist  General transfer comment verbal cues for hand  placement, min A for steadying  Balance  Overall balance assessment History  of Falls;Needs assistance  Sitting-balance support Feet supported  Sitting balance-Leahy Scale Fair  Standing balance support Bilateral upper extremity supported  Standing balance-Leahy Scale Poor  Standing balance comment reliant on UE support  OT - End of Session  Equipment Utilized During Treatment Rolling walker  Activity Tolerance Patient tolerated treatment well  Patient left in chair;with call bell/phone within reach;with chair alarm set;with nursing/sitter in room  Nurse Communication Mobility status  OT Assessment  OT Recommendation/Assessment Patient needs continued OT Services  OT Visit Diagnosis Other abnormalities of gait and mobility (R26.89);History of falling (Z91.81);Pain  Pain - Right/Left Right  Pain - part of body Hip  OT Problem List Decreased activity tolerance;Impaired balance (sitting and/or standing);Decreased safety awareness;Decreased knowledge of use of DME or AE;Pain  OT Plan  OT Frequency (ACUTE ONLY) Min 2X/week  OT Treatment/Interventions (ACUTE ONLY) Self-care/ADL training;DME and/or AE instruction;Therapeutic activities;Patient/family education;Balance training  AM-PAC OT "6 Clicks" Daily Activity Outcome Measure (Version 2)  Help from another person eating meals? 3  Help from another person taking care of personal grooming? 3  Help from another person toileting, which includes using toliet, bedpan, or urinal? 3  Help from another person bathing (including washing, rinsing, drying)? 2  Help from another person to put on and taking off regular upper body clothing? 3  Help from another person to put on and taking off regular lower body clothing? 2  6 Click Score 16  OT Recommendation  Follow Up Recommendations Home health OT;Supervision/Assistance - 24 hour  OT Equipment Tub/shower bench  Individuals Consulted  Consulted and Agree with Results and Recommendations Family  member/caregiver  Family Member Consulted daughter   Acute Rehab OT Goals  Patient Stated Goal "bring mom home"  OT Goal Formulation With patient/family  Time For Goal Achievement 09/08/19  Potential to Achieve Goals Good  OT Time Calculation  OT Start Time (ACUTE ONLY) 0940  OT Stop Time (ACUTE ONLY) 1008  OT Time Calculation (min) 28 min  OT General Charges  $OT Visit 1 Visit  OT Evaluation  $OT Eval Low Complexity 1 Low  OT Treatments  $Self Care/Home Management  8-22 mins  Written Expression  Dominant Hand Right   Marlyce Huge OT OT pager: [phone]

## 2019-08-25 NOTE — Progress Notes (Signed)
PROGRESS NOTE    Abigail Fowler  WJX:[id number] DOB: Nov 17, 1935 DOA: 08/23/2019 PCP: Britta MccreedyJones, Julie, MD   Brief Narrative:  Patient is [age] female with history of hypertension, coronary artery disease status post stent placement ,dementia who was brought to the emergency department from home after she had a mechanical fall.  She continued to walk on that leg over the weekend.  She went to orthopedics office on Monday and she was sent to the emergency department for further management after she was found to have right hip fracture.  She was seen by orthopedics and she underwent lateral hip arthroplasty.  Postop day 1.  Assessment & Plan:   Principal Problem:   Closed right hip fracture (HCC) Active Problems:   MCI (mild cognitive impairment)   CAD (coronary artery disease)   HTN (hypertension)   Right femoral neck fracture: Status post mechanical fall.  Underwent right total hip arthoplasty on 08/24/2019.  Orthopedics following.  Continue pain management.  PT/OT consulted and recommended Home health.  Continue DVT prophylaxis with aspirin  History of coronary artery disease: Status post stent placement in 2007.  Currently stable  Dementia/depression: History of Alzheimer's disease.  Continue Zoloft.  Continue delirium precautions.  Supportive care.  Confused at baseline  Hyperlipidemia: On Lipitor 20 mg at home.  Hypertension:BP stable now. Not on any medications at home.  Continue to monitor.  Continue as needed medications for now.  Might need to put on antihypertensives if remains hypertensive.  Acute normocytic anemia: Hemoglobin dropped from 12.4 to the range of 10.  Most likely this is associated  with surgery.  Will check CBC tomorrow  Nutrition Problem: Increased nutrient needs Etiology: post-op healing      DVT prophylaxis:SCD Code Status: Full Family Communication: called and discussed with daughter on phone today Status is: Inpatient  Remains inpatient appropriate  because:Unsafe d/c plan   Dispo: The patient is from: Home              Anticipated d/c is to: Home with Home health              Anticipated d/c date is: Tomorrow             Physical therapy recommended at least one more session of physical therapy before safe discharge.  Discharge planning tomorrow to home with home health.     Consultants: Orthopedics  Procedures: Total hip replacement right  Antimicrobials:  Anti-infectives (From admission, onward)   Start     Dose/Rate Route Frequency Ordered Stop   08/24/19 2100  ceFAZolin (ANCEF) IVPB 2g/100 mL premix        2 g 200 mL/hr over 30 Minutes Intravenous Every 6 hours 08/24/19 1844 08/25/19 0330   08/24/19 1315  ceFAZolin (ANCEF) IVPB 2g/100 mL premix        2 g 200 mL/hr over 30 Minutes Intravenous On call to O.R. 08/24/19 1251 08/24/19 1511   08/24/19 1257  ceFAZolin (ANCEF) 2-4 GM/100ML-% IVPB       Note to Pharmacy: Marney DoctorMentley, Pamela   : cabinet override      08/24/19 1257 08/24/19 1530      Subjective:  Patient seen and examined the bedside this morning.  Hemodynamically  stable ,sitting on the chair,denies any complaints.  Pain well controlled.  Objective: Vitals:   08/24/19 2054 08/24/19 2223 08/25/19 0155 08/25/19 0511  BP: (!) 154/80 114/74 (!) 114/57 114/61  Pulse: 71 74 73 73  Resp: 16 16 16 16   Temp: 98  F (36.7 C) 98 F (36.7 C) 97.8 F (36.6 C) 98.9 F (37.2 C)  TempSrc: Oral Oral Oral Oral  SpO2: 96% 99% 100% 100%  Weight:      Height:        Intake/Output Summary (Last 24 hours) at 08/25/2019 0836 Last data filed at 08/25/2019 0600 Gross per 24 hour  Intake 2979.06 ml  Output 1850 ml  Net 1129.06 ml   Filed Weights   08/23/19 1448  Weight: 60.8 kg    Examination:  General exam: Appears calm and comfortable , pleasant elderly female Respiratory system: Bilateral equal air entry, normal vesicular breath sounds, no wheezes or crackles  Cardiovascular system: S1 & S2 heard, RRR. No JVD,  murmurs, rubs, gallops or clicks. No pedal edema. Gastrointestinal system: Abdomen is nondistended, soft and nontender. No organomegaly or masses felt. Normal bowel sounds heard. Central nervous system: Alert and awake but not oriented. No focal neurological deficits. Extremities: No edema, no clubbing ,no cyanosis, clean surgical wound on the right hip Skin: No rashes, lesions or ulcers,no icterus ,no pallor   Data Reviewed: I have personally reviewed following labs and imaging studies  CBC: Recent Labs  Lab 08/23/19 1507 08/25/19 0243  WBC 7.7 6.8  NEUTROABS 4.6  --   HGB 12.4 10.7*  HCT 37.6 32.1*  MCV 98.9 99.4  PLT 204 166   Basic Metabolic Panel: Recent Labs  Lab 08/23/19 1507 08/25/19 0243  NA 137 137  K 3.7 3.8  CL 106 106  CO2 23 20*  GLUCOSE 119* 153*  BUN 24* 20  CREATININE 0.79 0.78  CALCIUM 9.3 8.3*   GFR: Estimated Creatinine Clearance: 44.2 mL/min (by C-G formula based on SCr of 0.78 mg/dL). Liver Function Tests: No results for input(s): AST, ALT, ALKPHOS, BILITOT, PROT, ALBUMIN in the last 168 hours. No results for input(s): LIPASE, AMYLASE in the last 168 hours. No results for input(s): AMMONIA in the last 168 hours. Coagulation Profile: No results for input(s): INR, PROTIME in the last 168 hours. Cardiac Enzymes: No results for input(s): CKTOTAL, CKMB, CKMBINDEX, TROPONINI in the last 168 hours. BNP (last 3 results) No results for input(s): PROBNP in the last 8760 hours. HbA1C: No results for input(s): HGBA1C in the last 72 hours. CBG: No results for input(s): GLUCAP in the last 168 hours. Lipid Profile: No results for input(s): CHOL, HDL, LDLCALC, TRIG, CHOLHDL, LDLDIRECT in the last 72 hours. Thyroid Function Tests: No results for input(s): TSH, T4TOTAL, FREET4, T3FREE, THYROIDAB in the last 72 hours. Anemia Panel: No results for input(s): VITAMINB12, FOLATE, FERRITIN, TIBC, IRON, RETICCTPCT in the last 72 hours. Sepsis Labs: No results  for input(s): PROCALCITON, LATICACIDVEN in the last 168 hours.  Recent Results (from the past 240 hour(s))  SARS Coronavirus 2 by RT PCR (hospital order, performed in [facility] hospital lab) Nasopharyngeal Nasopharyngeal Swab     Status: None   Collection Time: 08/23/19  3:12 PM   Specimen: Nasopharyngeal Swab  Result Value Ref Range Status   SARS Coronavirus 2 NEGATIVE NEGATIVE Final    Comment: (NOTE) SARS-CoV-2 target nucleic acids are NOT DETECTED.  The SARS-CoV-2 RNA is generally detectable in upper and lower respiratory specimens during the acute phase of infection. The lowest concentration of SARS-CoV-2 viral copies this assay can detect is 250 copies / mL. A negative result does not preclude SARS-CoV-2 infection and should not be used as the sole basis for treatment or other patient management decisions.  A negative result may occur  with improper specimen collection / handling, submission of specimen other than nasopharyngeal swab, presence of viral mutation(s) within the areas targeted by this assay, and inadequate number of viral copies (<250 copies / mL). A negative result must be combined with clinical observations, patient history, and epidemiological information.  Fact Sheet for Patients:   BoilerBrush.com.cy  Fact Sheet for Healthcare Providers: https://pope.com/  This test is not yet approved or  cleared by the [place] FDA and has been authorized for detection and/or diagnosis of SARS-CoV-2 by FDA under an Emergency Use Authorization (EUA).  This EUA will remain in effect (meaning this test can be used) for the duration of the COVID-19 declaration under Section 564(b)(1) of the Act, 21 U.S.C. section 360bbb-3(b)(1), unless the authorization is terminated or revoked sooner.  Performed at [facility], [address] [place], [place] [address]   Surgical pcr screen     Status: Abnormal    Collection Time: 08/23/19 11:52 PM   Specimen: Nasal Mucosa; Nasal Swab  Result Value Ref Range Status   MRSA, PCR NEGATIVE NEGATIVE Final   Staphylococcus aureus POSITIVE (A) NEGATIVE Final    Comment: (NOTE) The Xpert SA Assay (FDA approved for NASAL specimens in patients 55 years of age and older), is one component of a comprehensive surveillance program. It is not intended to diagnose infection nor to guide or monitor treatment. Performed at [facility], [address], [place], [place] [address]          Radiology Studies: CT Head Wo Contrast  Result Date: 08/23/2019 CLINICAL DATA:  Head trauma, minor. Additional provided: Fall last Friday night hitting back of head. EXAM: CT HEAD WITHOUT CONTRAST TECHNIQUE: Contiguous axial images were obtained from the base of the skull through the vertex without intravenous contrast. COMPARISON:  No pertinent prior exams are available for comparison. FINDINGS: Brain: Mild-to-moderate generalized parenchymal atrophy. Mild to moderate ill-defined hypoattenuation within the cerebral white matter which is nonspecific, but consistent with chronic small vessel ischemic disease. There is no acute intracranial hemorrhage. No demarcated cortical infarct. No extra-axial fluid collection. No evidence of intracranial mass. No midline shift. Vascular: No hyperdense vessel.  Atherosclerotic calcifications Skull: Normal. Negative for fracture or focal lesion. Sinuses/Orbits: Visualized orbits show no acute finding. Minimal ethmoid and maxillary sinus mucosal thickening. No significant mastoid effusion. IMPRESSION: No CT evidence of acute intracranial abnormality. Mild-to-moderate generalized parenchymal atrophy and chronic small vessel ischemic disease. Mild paranasal sinus mucosal thickening. Electronically Signed   By: Jackey Loge DO   On: 08/23/2019 17:37   Chest Portable 1 View  Result Date: 08/23/2019 CLINICAL DATA:  Preop for hip surgery  tomorrow. EXAM: PORTABLE CHEST 1 VIEW COMPARISON:  None. FINDINGS: The heart is normal in size. Coronary stent. Aortic atherosclerosis. Ill-defined opacity abutting the right heart border typically seen with epicardial fat pad. Pulmonary vasculature is normal. No consolidation, pleural effusion, or pneumothorax. No acute osseous abnormalities are seen. IMPRESSION: 1. Coronary stent. 2. No acute chest findings. Electronically Signed   By: Narda Rutherford M.D.   On: 08/23/2019 19:53   DG Knee Complete 4 Views Right  Result Date: 08/23/2019 CLINICAL DATA:  Larey Seat, right knee pain and ecchymosis EXAM: RIGHT KNEE - COMPLETE 4+ VIEW COMPARISON:  None. FINDINGS: Frontal, bilateral oblique, lateral views of the right knee are obtained. No acute fracture, subluxation, or dislocation. There is mild 3 compartmental osteoarthritis greatest in the medial compartment. Soft tissue calcification is seen in the infrapatellar fat pad. No joint  effusion. IMPRESSION: 1. Three compartmental osteoarthritis. 2. No acute displaced fracture. Electronically Signed   By: Sharlet Salina M.D.   On: 08/23/2019 16:59   DG C-Arm 1-60 Min-No Report  Result Date: 08/24/2019 Fluoroscopy was utilized by the requesting physician.  No radiographic interpretation.   DG Hip Port Unilat With Pelvis 1V Right  Result Date: 08/24/2019 CLINICAL DATA:  Right total hip arthroplasty EXAM: DG HIP (WITH OR WITHOUT PELVIS) 1V PORT RIGHT COMPARISON:  None. FINDINGS: Single view radiograph pelvis and right hip demonstrates surgical changes of right total hip arthroplasty. Arthroplasty components overlie the expected position. No unexpected fracture or dislocation. Gas is seen within the soft tissues surrounding the right hip, postsurgical in nature. Limited evaluation of the left hip is unremarkable. Asymmetric degenerative disc disease is noted at L4-5, not well characterized on this exam. IMPRESSION: Postsurgical changes of right total hip arthroplasty.  Electronically Signed   By: Helyn Numbers MD   On: 08/24/2019 16:56   DG HIP OPERATIVE UNILAT W OR W/O PELVIS RIGHT  Result Date: 08/24/2019 CLINICAL DATA:  Intraoperative examination, right total hip arthroplasty. EXAM: OPERATIVE RIGHT HIP (WITH PELVIS IF PERFORMED)  VIEWS TECHNIQUE: Fluoroscopic spot image(s) were submitted for interpretation post-operatively. COMPARISON:  None. FINDINGS: Six fluoroscopic intraoperative radiographs of the right hip are presented for interpretation postprocedurally. These images demonstrate serial changes of a right total hip arthroplasty. Final image demonstrates arthroplasty components overlying the expected position. No unexpected fracture or dislocation. FLUOROSCOPY TIME:  6 seconds IMPRESSION: Right total hip arthroplasty. Electronically Signed   By: Helyn Numbers MD   On: 08/24/2019 17:00        Scheduled Meds: . acetaminophen  1,000 mg Oral Q6H  . aspirin EC  81 mg Oral BID  . atorvastatin  20 mg Oral Daily  . Chlorhexidine Gluconate Cloth  6 each Topical Daily  . docusate sodium  100 mg Oral BID  . ferrous sulfate  325 mg Oral TID PC  . multivitamin with minerals  1 tablet Oral Daily  . mupirocin ointment  1 application Nasal BID  . polyethylene glycol  17 g Oral Daily  . sertraline  50 mg Oral Daily   Continuous Infusions: . sodium chloride 100 mL/hr at 08/25/19 0600  . methocarbamol (ROBAXIN) IV       LOS: 2 days    Time spent: 25 mins,More than 50% of that time was spent in counseling and/or coordination of care.      Burnadette Pop, MD [facility] P8/18/2021, 8:36 AM

## 2019-08-25 NOTE — Plan of Care (Signed)
Plan of care reviewed and discussed with the patient. 

## 2019-08-25 NOTE — Progress Notes (Signed)
     Subjective: 1 Day Post-Op Procedure(s) (LRB): TOTAL HIP ARTHROPLASTY ANTERIOR APPROACH (Right)   Patient resting comfortably in bed.  No reported events throughout the night.   Discussed the procedure and expectations moving forward.    Objective:   VITALS:   Vitals:   08/25/19 0155 08/25/19 0511  BP: (!) 114/57 114/61  Pulse: 73 73  Resp: 16 16  Temp: 97.8 F (36.6 C) 98.9 F (37.2 C)  SpO2: 100% 100%    Dorsiflexion/Plantar flexion intact Incision: no drainage, dressing had been removed by the patient, per nurse No cellulitis present Compartment soft  LABS Recent Labs    08/23/19 1507 08/25/19 0243  HGB 12.4 10.7*  HCT 37.6 32.1*  WBC 7.7 6.8  PLT 204 166    Recent Labs    08/23/19 1507 08/25/19 0243  NA 137 137  K 3.7 3.8  BUN 24* 20  CREATININE 0.79 0.78  GLUCOSE 119* 153*     Assessment/Plan: 1 Day Post-Op Procedure(s) (LRB): TOTAL HIP ARTHROPLASTY ANTERIOR APPROACH (Right) Discussed with nurse to place a new Aquacel dressing over the surgery site Up with therapy Discharge disposition TBD  Ortho recommendations:  ASA 81 mg bid for 4 weeks for anticoagulation, unless other medically indicated.  Tramadol and APAP for pain management (Rx written).  MiraLax and Colace for constipation  Iron 325 mg tid for 2-3 weeks   WBAT on the right leg.  Dressing to remain in place until follow in clinic in 2 weeks.  Dressing is waterproof and may shower with it in place.  Follow up in 2 weeks at [facility]. Follow up with OLIN,Kylil Swopes D in 2 weeks.  Contact information:  [facility] [address], [address] [place] [address] [phone]            Lanney Gins PA-C  [facility]  [facility] [address], [address], [place], [place] [address] Phone: [phone] [email or web address] Facebook  [employer]

## 2019-08-25 NOTE — Progress Notes (Signed)
Physical Therapy Treatment Patient Details Name: Abigail Fowler MRN: [id number] DOB: August 23, 1935 Today's Date: 08/25/2019    History of Present Illness 84 y.o. female with medical history significant of MCI, HTN, CAD s/p DES to LAD in 2007, Alzheimers dementia and admitted with right femoral neck hip fracture.  Pt s/p right direct anterior THA 08/24/19    PT Comments    Daughter Aurea Graff) present this afternoon.  She reports pt has been living with her since Covid began (pt was in a facility and still does have this apt).  Pt has a rollator and daughter works from home and can provide 24/7 supervision. Daughter observed pt ambulating in hallway and assist back to bed.  Recommend HHPT upon d/c, and daughter okay with pt returning home with her.   Follow Up Recommendations  Supervision/Assistance - 24 hour;Home health PT     Equipment Recommendations  Rolling walker with 5" wheels    Recommendations for Other Services       Precautions / Restrictions Precautions Precautions: Fall Restrictions Weight Bearing Restrictions: Yes RLE Weight Bearing: Weight bearing as tolerated    Mobility  Bed Mobility Overal bed mobility: Needs Assistance Bed Mobility: Sit to Supine     Supine to sit: Min assist Sit to supine: Min guard   General bed mobility comments: cues for technique  Transfers Overall transfer level: Needs assistance Equipment used: Rolling walker (2 wheeled) Transfers: Sit to/from Stand Sit to Stand: Min guard         General transfer comment: verbal cues for UE positioning  Ambulation/Gait Ambulation/Gait assistance: Min guard Gait Distance (Feet): 80 Feet Assistive device: Rolling walker (2 wheeled) Gait Pattern/deviations: Step-through pattern;Decreased stride length;Narrow base of support;Antalgic     General Gait Details: verbal cues for use of RW and UEs through RW for pain control, distance to tolerance   Stairs             Wheelchair Mobility     Modified Rankin (Stroke Patients Only)       Balance Overall balance assessment: History of Falls;Needs assistance Sitting-balance support: Feet supported Sitting balance-Leahy Scale: Fair     Standing balance support: Bilateral upper extremity supported Standing balance-Leahy Scale: Poor Standing balance comment: reliant on UE support                            Cognition Arousal/Alertness: Awake/alert Behavior During Therapy: WFL for tasks assessed/performed Overall Cognitive Status: History of cognitive impairments - at baseline                                 General Comments: pleasant and follows simple commands, hx dementia      Exercises      General Comments        Pertinent Vitals/Pain Pain Assessment: Faces Faces Pain Scale: Hurts a little bit Pain Location: right hip with movement Pain Descriptors / Indicators: Sore Pain Intervention(s): Repositioned;Monitored during session    Home Living Family/patient expects to be discharged to:: Private residence Living Arrangements: Children Available Help at Discharge: Family;Available 24 hours/day Type of Home: House Home Access: Stairs to enter   Home Layout: Two level;Bed/bath upstairs Home Equipment: Dan Humphreys - 2 wheels Additional Comments: spoke with patient's DTR, since COVID moved patient into DTRs home    Prior Function Level of Independence: Needs assistance  Gait / Transfers Assistance Needed: does not use walker in  DTR's house, for community distances/outdoor primarily  ADL's / Homemaking Assistance Needed: DTR assists with tub transfer/bathing, otherwise patient independent with dressing, toileting, eating, fixed herself sandwich in kitchen Comments: pt reports poor memory, hx dementia   PT Goals (current goals can now be found in the care plan section) Acute Rehab PT Goals Patient Stated Goal: "bring mom home" PT Goal Formulation: With patient Time For Goal Achievement:  09/08/19 Progress towards PT goals: Progressing toward goals    Frequency    Min 3X/week      PT Plan Current plan remains appropriate    Co-evaluation              AM-PAC PT "6 Clicks" Mobility   Outcome Measure  Help needed turning from your back to your side while in a flat bed without using bedrails?: A Little Help needed moving from lying on your back to sitting on the side of a flat bed without using bedrails?: A Little Help needed moving to and from a bed to a chair (including a wheelchair)?: A Little Help needed standing up from a chair using your arms (e.g., wheelchair or bedside chair)?: A Little Help needed to walk in hospital room?: A Little Help needed climbing 3-5 steps with a railing? : A Little 6 Click Score: 18    End of Session Equipment Utilized During Treatment: Gait belt Activity Tolerance: Patient tolerated treatment well Patient left: with call bell/phone within reach;in bed;with bed alarm set;with family/visitor present Nurse Communication: Mobility status PT Visit Diagnosis: Other abnormalities of gait and mobility (R26.89)     Time: 9532-0233 PT Time Calculation (min) (ACUTE ONLY): 9 min  Charges:  $Gait Training: 8-22 mins                    Paulino Door, DPT Acute Rehabilitation Services Pager: [phone] Office: [phone]  Maida Sale E 08/25/2019, 2:45 PM

## 2019-08-25 NOTE — Evaluation (Signed)
Physical Therapy Evaluation Patient Details Name: Abigail Fowler MRN: [id number] DOB: September 07, 1935 Today's Date: 08/25/2019   History of Present Illness  84 y.o. female with medical history significant of MCI, HTN, CAD s/p DES to LAD in 2007, Alzheimers dementia and admitted with right femoral neck hip fracture.  Pt s/p right direct anterior THA 08/24/19  Clinical Impression  Pt admitted with above diagnosis.  Pt currently with functional limitations due to the deficits listed below (see PT Problem List). Pt will benefit from skilled PT to increase their independence and safety with mobility to allow discharge to the venue listed below.  Pt poor historian so uncertain of previous living environment however pt appears ambulatory with assistive device at baseline.  Pt reports poor memory.  Pt does follow commands and able to ambulate with min assist today in hallway.  Recommend 24/7 assist upon d/c for safety, if not possible then pt would benefit from SNF.     Follow Up Recommendations Supervision/Assistance - 24 hour;Home health PT (needs 24/7 supervision for safety, if not available then may need SNF)    Equipment Recommendations  Rolling walker with 5" wheels    Recommendations for Other Services       Precautions / Restrictions Precautions Precautions: Fall Restrictions Weight Bearing Restrictions: No RLE Weight Bearing: Weight bearing as tolerated      Mobility  Bed Mobility Overal bed mobility: Needs Assistance Bed Mobility: Supine to Sit     Supine to sit: Min assist     General bed mobility comments: cues for technique, slight assist for trunk upright  Transfers Overall transfer level: Needs assistance Equipment used: Rolling walker (2 wheeled) Transfers: Sit to/from Stand Sit to Stand: Min assist         General transfer comment: verbal cues for UE positioning, assist to rise and steady  Ambulation/Gait Ambulation/Gait assistance: Min guard;Min assist Gait  Distance (Feet): 120 Feet Assistive device: Rolling walker (2 wheeled) Gait Pattern/deviations: Step-through pattern;Decreased stride length;Narrow base of support;Antalgic     General Gait Details: verbal cues for use of RW and UEs through RW for pain control, distance to tolerance, recliner following first time ambulating  Stairs            Wheelchair Mobility    Modified Rankin (Stroke Patients Only)       Balance Overall balance assessment: History of Falls;Needs assistance         Standing balance support: Bilateral upper extremity supported Standing balance-Leahy Scale: Poor Standing balance comment: reliant on UE support                             Pertinent Vitals/Pain Pain Assessment: Faces Faces Pain Scale: Hurts little more Pain Location: right hip with movement Pain Descriptors / Indicators: Grimacing;Sore;Guarding Pain Intervention(s): Monitored during session;Repositioned;Ice applied    Home Living Family/patient expects to be discharged to:: Unsure               Home Equipment: Dan Humphreys - 4 wheels Additional Comments: pt reports living in a facility, meals brought to room, mentions use of assistive device describing rollator however chart review mentions pt from home and brought in by daughter to ED    Prior Function Level of Independence: Needs assistance   Gait / Transfers Assistance Needed: ambulatory with assistive device     Comments: pt reports poor memory, hx dementia     Hand Dominance        Extremity/Trunk  Assessment        Lower Extremity Assessment Lower Extremity Assessment: RLE deficits/detail;Generalized weakness RLE Deficits / Details: grossly at least 2+/5 hip strength observed       Communication   Communication: No difficulties  Cognition Arousal/Alertness: Awake/alert Behavior During Therapy: WFL for tasks assessed/performed Overall Cognitive Status: History of cognitive impairments - at  baseline                                 General Comments: pleasant and follows simple commands, hx dementia      General Comments      Exercises     Assessment/Plan    PT Assessment Patient needs continued PT services  PT Problem List Decreased strength;Decreased mobility;Decreased activity tolerance;Decreased knowledge of use of DME;Decreased balance;Decreased cognition;Decreased knowledge of precautions;Pain       PT Treatment Interventions DME instruction;Gait training;Balance training;Therapeutic exercise;Functional mobility training;Therapeutic activities;Patient/family education    PT Goals (Current goals can be found in the Care Plan section)  Acute Rehab PT Goals PT Goal Formulation: With patient Time For Goal Achievement: 09/08/19    Frequency Min 3X/week   Barriers to discharge        Co-evaluation               AM-PAC PT "6 Clicks" Mobility  Outcome Measure Help needed turning from your back to your side while in a flat bed without using bedrails?: A Little Help needed moving from lying on your back to sitting on the side of a flat bed without using bedrails?: A Little Help needed moving to and from a bed to a chair (including a wheelchair)?: A Little Help needed standing up from a chair using your arms (e.g., wheelchair or bedside chair)?: A Little Help needed to walk in hospital room?: A Little Help needed climbing 3-5 steps with a railing? : A Lot 6 Click Score: 17    End of Session Equipment Utilized During Treatment: Gait belt Activity Tolerance: Patient tolerated treatment well Patient left: in chair;with call bell/phone within reach;with chair alarm set Nurse Communication: Mobility status PT Visit Diagnosis: Other abnormalities of gait and mobility (R26.89)    Time: 3716-9678 PT Time Calculation (min) (ACUTE ONLY): 30 min   Charges:   PT Evaluation $PT Eval Low Complexity: 1 Low PT Treatments $Gait Training: 8-22  mins       Paulino Door, DPT Acute Rehabilitation Services Pager: [phone] Office: [phone]   Maida Sale E 08/25/2019, 11:17 AM

## 2019-08-26 LAB — CBC
HCT: 28.7 % — ABNORMAL LOW (ref 36.0–46.0)
Hemoglobin: 9.3 g/dL — ABNORMAL LOW (ref 12.0–15.0)
MCH: 33 pg (ref 26.0–34.0)
MCHC: 32.4 g/dL (ref 30.0–36.0)
MCV: 101.8 fL — ABNORMAL HIGH (ref 80.0–100.0)
Platelets: 149 10*3/uL — ABNORMAL LOW (ref 150–400)
RBC: 2.82 MIL/uL — ABNORMAL LOW (ref 3.87–5.11)
RDW: 12.7 % (ref 11.5–15.5)
WBC: 8.9 10*3/uL (ref 4.0–10.5)
nRBC: 0 % (ref 0.0–0.2)

## 2019-08-26 MED ORDER — MUPIROCIN 2 % EX OINT: 1.0000 "application " | TOPICAL_OINTMENT | Freq: Two times a day (BID) | CUTANEOUS | 0 refills | Status: AC

## 2019-08-26 MED ORDER — METHOCARBAMOL 500 MG PO TABS: 500.0000 mg | ORAL_TABLET | Freq: Four times a day (QID) | ORAL | 0 refills | Status: AC | PRN

## 2019-08-26 MED ORDER — DOCUSATE SODIUM 100 MG PO CAPS: 100.0000 mg | ORAL_CAPSULE | Freq: Two times a day (BID) | ORAL | 0 refills | Status: AC

## 2019-08-26 MED ORDER — FERROUS SULFATE 325 (65 FE) MG PO TABS: 325.0000 mg | ORAL_TABLET | Freq: Three times a day (TID) | ORAL | 3 refills | Status: AC

## 2019-08-26 MED ORDER — ADULT MULTIVITAMIN W/MINERALS CH: 1.0000 | ORAL_TABLET | Freq: Every day | ORAL | 0 refills | Status: AC

## 2019-08-26 MED ORDER — POLYETHYLENE GLYCOL 3350 17 G PO PACK: 17.0000 g | PACK | Freq: Every day | ORAL | 0 refills | Status: AC

## 2019-08-26 NOTE — Discharge Summary (Signed)
Physician Discharge Summary  Abigail Fowler PIR:[id number] DOB: February 26, 1935 DOA: 08/23/2019  PCP: Britta Mccreedy, MD  Admit date: 08/23/2019 Discharge date: 08/26/2019  Recommendations for Outpatient Follow-up:  1. The patient will be discharged to home with PT/OT 2. Follow up with orthopedic surgery as directed.  3. Weight bearing as tolerated on the right lower extremity 4. Follow up with PCP in 7-10 days.   Follow-up Information    Durene Romans, MD. Schedule an appointment as soon as possible for a visit in 2 weeks.   Specialty: Orthopedic Surgery Contact information: [address] [place] [address] [phone]        [facility] Follow up.   Why: to provide home health physical therapy visits - the office will call you to set up visits Contact information: [phone]             Discharge Diagnoses: Principal diagnosis is #1 1. Right femoral neck fracture s/p ORIV 08/24/2019 2. Coronary artery disease 3. Dementia 4. Depression 5. Hyperlipidemia 6. Hypertension 7. Normocytic anemia  Discharge Condition: Fair  Disposition: Home with home healthy and walker  Diet recommendation: Heart healthy  Filed Weights   08/23/19 1448  Weight: 60.8 kg    History of present illness:  Patient is 84 female with history of hypertension, coronary artery disease status post stent placement ,dementia who was brought to the emergency department from home after she had a mechanical fall.  She continued to walk on that leg over the weekend.  She went to orthopedics office on Monday and she was sent to the emergency department for further management after she was found to have right hip fracture.  She was seen by orthopedics and she underwent lateral hip arthroplasty.  Postop day 1.   Hospital Course: The patient has undergone ORIF with orthopedic surgery on 08/24/2019. She tolerated the procedure well. The patient has done well with physical therapy. They  have recommended discharge to home with home health. She has been cleared for discharge to home by orthopedic surgery.The patient will be discharged to home in fair condition.  Today's assessment: S: The patient is resting comfortably. No new complaints. O: Vitals:  Vitals:   08/26/19 0603 08/26/19 1347  BP: 127/60 (!) 108/53  Pulse: 79 80  Resp: 16 17  Temp: 98.2 F (36.8 C) 98.2 F (36.8 C)  SpO2: 96% 98%   Exam:  Constitutional:  . The patient is awake, alert, and oriented x 3. No acute distress. Respiratory:  . No increased work of breathing. . No wheezes, rales, or rhonchi . No tactile fremitus Cardiovascular:  . Regular rate and rhythm . No murmurs, ectopy, or gallups. . No lateral PMI. No thrills. Abdomen:  . Abdomen is soft, non-tender, non-distended . No hernias, masses, or organomegaly . Normoactive bowel sounds.  Musculoskeletal:  . No cyanosis, clubbing, or edema Skin:  . No rashes, lesions, ulcers . palpation of skin: no induration or nodules Neurologic:  . CN 2-12 intact . Sensation all 4 extremities intact Psychiatric:  . Mental status o Mood, affect appropriate o Orientation to person, place, time  . judgment and insight appear intact  Discharge Instructions  Discharge Instructions    Activity as tolerated - No restrictions   Complete by: As directed    Call MD for:  persistant nausea and vomiting   Complete by: As directed    Call MD for:  severe uncontrolled pain   Complete by: As directed    Diet -  low sodium heart healthy   Complete by: As directed    Discharge wound care:   Complete by: As directed    As per orthopedic surgery.   Increase activity slowly   Complete by: As directed    Weight bearing as tolerated   Complete by: As directed    Laterality: right   Extremity: Lower     Allergies as of 08/26/2019      Reactions   Codeine Hives, Swelling, Anxiety   Throat swelling   Sulfa Antibiotics Hives, Rash   Methotrexate  Other (See Comments)   "flu-like symptoms, weakness"   Penicillins Swelling, Hypertension   Plaquenil [hydroxychloroquine Sulfate] Nausea And Vomiting, Other (See Comments)   Abdominal pain, GI upset   Atenolol Anxiety, Palpitations   Biaxin [clarithromycin] Palpitations, Other (See Comments)   Chest pains   Metoprolol Anxiety, Palpitations      Medication List    STOP taking these medications   meloxicam 15 MG tablet Commonly known as: MOBIC     TAKE these medications   aspirin 81 MG chewable tablet Commonly known as: Aspirin Childrens Chew 1 tablet (81 mg total) by mouth 2 (two) times daily. Take for 4 weeks, then resume regular dose.   atorvastatin 20 MG tablet Commonly known as: LIPITOR Take 20 mg by mouth daily.   docusate sodium 100 MG capsule Commonly known as: COLACE Take 1 capsule (100 mg total) by mouth 2 (two) times daily.   ferrous sulfate 325 (65 FE) MG tablet Take 1 tablet (325 mg total) by mouth 3 (three) times daily after meals.   methocarbamol 500 MG tablet Commonly known as: ROBAXIN Take 1 tablet (500 mg total) by mouth every 6 (six) hours as needed for muscle spasms.   multivitamin with minerals Tabs tablet Take 1 tablet by mouth daily. Start taking on: August 27, 2019   mupirocin ointment 2 % Commonly known as: BACTROBAN Place 1 application into the nose 2 (two) times daily.   polyethylene glycol 17 g packet Commonly known as: MIRALAX / GLYCOLAX Take 17 g by mouth daily. Start taking on: August 27, 2019   potassium chloride SA 20 MEQ tablet Commonly known as: KLOR-CON Take 20 mEq by mouth daily.   sertraline 50 MG tablet Commonly known as: ZOLOFT Take 50 mg by mouth daily.   traMADol 50 MG tablet Commonly known as: Ultram Take 1-2 tablets (50-100 mg total) by mouth every 6 (six) hours as needed.            Durable Medical Equipment  (From admission, onward)         Start     Ordered   08/26/19 1331  For home use only DME  Walker rolling  Once       Question Answer Comment  Walker: With 5 Inch Wheels   Patient needs a walker to treat with the following condition Status post right hip replacement      08/26/19 1330   08/26/19 1323  DME Walker  Once       Question Answer Comment  Walker: With 5 Inch Wheels   Patient needs a walker to treat with the following condition Ambulatory dysfunction      08/26/19 1353   08/26/19 1322  DME 3-in-1  Once        08/26/19 1353           Discharge Care Instructions  (From admission, onward)         Start  Ordered   08/26/19 0000  Discharge wound care:       Comments: As per orthopedic surgery.   08/26/19 1353   08/25/19 0000  Weight bearing as tolerated       Question Answer Comment  Laterality right   Extremity Lower      08/25/19 0905         Allergies  Allergen Reactions  . Codeine Hives, Swelling and Anxiety    Throat swelling  . Sulfa Antibiotics Hives and Rash  . Methotrexate Other (See Comments)    "flu-like symptoms, weakness"  . Penicillins Swelling and Hypertension  . Plaquenil [Hydroxychloroquine Sulfate] Nausea And Vomiting and Other (See Comments)    Abdominal pain, GI upset  . Atenolol Anxiety and Palpitations  . Biaxin [Clarithromycin] Palpitations and Other (See Comments)    Chest pains  . Metoprolol Anxiety and Palpitations    The results of significant diagnostics from this hospitalization (including imaging, microbiology, ancillary and laboratory) are listed below for reference.    Significant Diagnostic Studies: CT Head Wo Contrast  Result Date: 08/23/2019 CLINICAL DATA:  Head trauma, minor. Additional provided: Fall last Friday night hitting back of head. EXAM: CT HEAD WITHOUT CONTRAST TECHNIQUE: Contiguous axial images were obtained from the base of the skull through the vertex without intravenous contrast. COMPARISON:  No pertinent prior exams are available for comparison. FINDINGS: Brain: Mild-to-moderate generalized  parenchymal atrophy. Mild to moderate ill-defined hypoattenuation within the cerebral white matter which is nonspecific, but consistent with chronic small vessel ischemic disease. There is no acute intracranial hemorrhage. No demarcated cortical infarct. No extra-axial fluid collection. No evidence of intracranial mass. No midline shift. Vascular: No hyperdense vessel.  Atherosclerotic calcifications Skull: Normal. Negative for fracture or focal lesion. Sinuses/Orbits: Visualized orbits show no acute finding. Minimal ethmoid and maxillary sinus mucosal thickening. No significant mastoid effusion. IMPRESSION: No CT evidence of acute intracranial abnormality. Mild-to-moderate generalized parenchymal atrophy and chronic small vessel ischemic disease. Mild paranasal sinus mucosal thickening. Electronically Signed   By: Jackey Loge DO   On: 08/23/2019 17:37   Chest Portable 1 View  Result Date: 08/23/2019 CLINICAL DATA:  Preop for hip surgery tomorrow. EXAM: PORTABLE CHEST 1 VIEW COMPARISON:  None. FINDINGS: The heart is normal in size. Coronary stent. Aortic atherosclerosis. Ill-defined opacity abutting the right heart border typically seen with epicardial fat pad. Pulmonary vasculature is normal. No consolidation, pleural effusion, or pneumothorax. No acute osseous abnormalities are seen. IMPRESSION: 1. Coronary stent. 2. No acute chest findings. Electronically Signed   By: Narda Rutherford M.D.   On: 08/23/2019 19:53   DG Knee Complete 4 Views Right  Result Date: 08/23/2019 CLINICAL DATA:  Larey Seat, right knee pain and ecchymosis EXAM: RIGHT KNEE - COMPLETE 4+ VIEW COMPARISON:  None. FINDINGS: Frontal, bilateral oblique, lateral views of the right knee are obtained. No acute fracture, subluxation, or dislocation. There is mild 3 compartmental osteoarthritis greatest in the medial compartment. Soft tissue calcification is seen in the infrapatellar fat pad. No joint effusion. IMPRESSION: 1. Three compartmental  osteoarthritis. 2. No acute displaced fracture. Electronically Signed   By: Sharlet Salina M.D.   On: 08/23/2019 16:59   DG C-Arm 1-60 Min-No Report  Result Date: 08/24/2019 Fluoroscopy was utilized by the requesting physician.  No radiographic interpretation.   DG Hip Port Unilat With Pelvis 1V Right  Result Date: 08/24/2019 CLINICAL DATA:  Right total hip arthroplasty EXAM: DG HIP (WITH OR WITHOUT PELVIS) 1V PORT RIGHT COMPARISON:  None. FINDINGS: Single  view radiograph pelvis and right hip demonstrates surgical changes of right total hip arthroplasty. Arthroplasty components overlie the expected position. No unexpected fracture or dislocation. Gas is seen within the soft tissues surrounding the right hip, postsurgical in nature. Limited evaluation of the left hip is unremarkable. Asymmetric degenerative disc disease is noted at L4-5, not well characterized on this exam. IMPRESSION: Postsurgical changes of right total hip arthroplasty. Electronically Signed   By: Helyn Numbers MD   On: 08/24/2019 16:56   DG HIP OPERATIVE UNILAT W OR W/O PELVIS RIGHT  Result Date: 08/24/2019 CLINICAL DATA:  Intraoperative examination, right total hip arthroplasty. EXAM: OPERATIVE RIGHT HIP (WITH PELVIS IF PERFORMED)  VIEWS TECHNIQUE: Fluoroscopic spot image(s) were submitted for interpretation post-operatively. COMPARISON:  None. FINDINGS: Six fluoroscopic intraoperative radiographs of the right hip are presented for interpretation postprocedurally. These images demonstrate serial changes of a right total hip arthroplasty. Final image demonstrates arthroplasty components overlying the expected position. No unexpected fracture or dislocation. FLUOROSCOPY TIME:  6 seconds IMPRESSION: Right total hip arthroplasty. Electronically Signed   By: Helyn Numbers MD   On: 08/24/2019 17:00    Microbiology: Recent Results (from the past 240 hour(s))  SARS Coronavirus 2 by RT PCR (hospital order, performed in [facility]  hospital lab) Nasopharyngeal Nasopharyngeal Swab     Status: None   Collection Time: 08/23/19  3:12 PM   Specimen: Nasopharyngeal Swab  Result Value Ref Range Status   SARS Coronavirus 2 NEGATIVE NEGATIVE Final    Comment: (NOTE) SARS-CoV-2 target nucleic acids are NOT DETECTED.  The SARS-CoV-2 RNA is generally detectable in upper and lower respiratory specimens during the acute phase of infection. The lowest concentration of SARS-CoV-2 viral copies this assay can detect is 250 copies / mL. A negative result does not preclude SARS-CoV-2 infection and should not be used as the sole basis for treatment or other patient management decisions.  A negative result may occur with improper specimen collection / handling, submission of specimen other than nasopharyngeal swab, presence of viral mutation(s) within the areas targeted by this assay, and inadequate number of viral copies (<250 copies / mL). A negative result must be combined with clinical observations, patient history, and epidemiological information.  Fact Sheet for Patients:   BoilerBrush.com.cy  Fact Sheet for Healthcare Providers: https://pope.com/  This test is not yet approved or  cleared by the [place] FDA and has been authorized for detection and/or diagnosis of SARS-CoV-2 by FDA under an Emergency Use Authorization (EUA).  This EUA will remain in effect (meaning this test can be used) for the duration of the COVID-19 declaration under Section 564(b)(1) of the Act, 21 U.S.C. section 360bbb-3(b)(1), unless the authorization is terminated or revoked sooner.  Performed at [facility], [address] [place], [place] [address]   Surgical pcr screen     Status: Abnormal   Collection Time: 08/23/19 11:52 PM   Specimen: Nasal Mucosa; Nasal Swab  Result Value Ref Range Status   MRSA, PCR NEGATIVE NEGATIVE Final   Staphylococcus aureus POSITIVE (A)  NEGATIVE Final    Comment: (NOTE) The Xpert SA Assay (FDA approved for NASAL specimens in patients 20 years of age and older), is one component of a comprehensive surveillance program. It is not intended to diagnose infection nor to guide or monitor treatment. Performed at [facility], [address], [place], [place] [address]      Labs: Basic Metabolic Panel: Recent Labs  Lab 08/23/19 1507 08/25/19 0243  NA 137 137  K 3.7 3.8  CL 106 106  CO2 23 20*  GLUCOSE 119* 153*  BUN 24* 20  CREATININE 0.79 0.78  CALCIUM 9.3 8.3*   Liver Function Tests: No results for input(s): AST, ALT, ALKPHOS, BILITOT, PROT, ALBUMIN in the last 168 hours. No results for input(s): LIPASE, AMYLASE in the last 168 hours. No results for input(s): AMMONIA in the last 168 hours. CBC: Recent Labs  Lab 08/23/19 1507 08/25/19 0243 08/26/19 0242  WBC 7.7 6.8 8.9  NEUTROABS 4.6  --   --   HGB 12.4 10.7* 9.3*  HCT 37.6 32.1* 28.7*  MCV 98.9 99.4 101.8*  PLT 204 166 149*   Cardiac Enzymes: No results for input(s): CKTOTAL, CKMB, CKMBINDEX, TROPONINI in the last 168 hours. BNP: BNP (last 3 results) No results for input(s): BNP in the last 8760 hours.  ProBNP (last 3 results) No results for input(s): PROBNP in the last 8760 hours.  CBG: No results for input(s): GLUCAP in the last 168 hours.  Principal Problem:   Closed right hip fracture (HCC) Active Problems:   MCI (mild cognitive impairment)   CAD (coronary artery disease)   HTN (hypertension)   Time coordinating discharge: 38 minutes.  Signed:        Cagney Steenson, DO [facility]  08/26/2019, 6:48 PM

## 2019-08-26 NOTE — TOC Transition Note (Signed)
Transition of Care [facility]) - CM/SW Discharge Note   Patient Details  Name: Abigail Fowler MRN: [id number] Date of Birth: [birth year]  Transition of Care [facility] [place]) CM/SW Contact:  Lennart Pall, LCSW Phone Number: 08/26/2019, 1:37 PM   Clinical Narrative:    Met with pt and daughter and confirming plans for dc home with daughter today (daughter's address:  [place]).  HH and DME orders placed (see below).  Ready for d/c.  Final next level of care: [place] Barriers to Discharge: Barriers Resolved   Patient Goals and CMS Choice Patient states their goals for this hospitalization and ongoing recovery are:: go home with daughter today CMS Medicare.gov Compare Post Acute Care list provided to:: Patient Choice offered to / list presented to : Patient, Adult Children  Discharge Placement                       Discharge Plan and Services                DME Arranged: Walker rolling DME Agency: [employer] Date DME Agency Contacted: 08/26/19 Time DME Agency Contacted: [phone] Representative spoke with at DME Agency: [place]: PT [place]: [facility] [employer] Date [place]: 08/26/19 Time [place]: 8307 Representative spoke with at [place]: [place] [facility] Interventions     Readmission Risk Interventions No flowsheet data found.

## 2019-08-26 NOTE — Care Management Important Message (Signed)
Important Message  Patient Details IM Letter given to the Patient Name: Abigail Fowler MRN: [id number] Date of Birth: [birth year]   Medicare Important Message Given:  Yes     Caren Macadam 08/26/2019, 10:54 AM

## 2019-08-26 NOTE — Progress Notes (Signed)
     Subjective: 2 Days Post-Op Procedure(s) (LRB): TOTAL HIP ARTHROPLASTY ANTERIOR APPROACH (Right)   Patient reports pain as mild, pain controlled.  Feels better than she did yesterday.  Discussed the procedure and expectations moving forward.   Objective:   VITALS:   Vitals:   08/25/19 2127 08/26/19 0603  BP: (!) 101/54 127/60  Pulse: 79 79  Resp: 16 16  Temp: (!) 97.5 F (36.4 C) 98.2 F (36.8 C)  SpO2: 99% 96%    Dorsiflexion/Plantar flexion intact Incision: dressing C/D/I No cellulitis present Compartment soft  LABS Recent Labs    08/23/19 1507 08/25/19 0243 08/26/19 0242  HGB 12.4 10.7* 9.3*  HCT 37.6 32.1* 28.7*  WBC 7.7 6.8 8.9  PLT 204 166 149*    Recent Labs    08/23/19 1507 08/25/19 0243  NA 137 137  K 3.7 3.8  BUN 24* 20  CREATININE 0.79 0.78  GLUCOSE 119* 153*     Assessment/Plan: 2 Days Post-Op Procedure(s) (LRB): TOTAL HIP ARTHROPLASTY ANTERIOR APPROACH (Right)  New Aquacel dressing over placed over the surgery incision yesterday and still in place Up with therapy Discharge disposition TBD  Ortho recommendations:  ASA 81 mg bid for 4 weeks for anticoagulation, unless other medically indicated.  Tramadol and APAP for pain management (Rx written).  MiraLax and Colace for constipation  Iron 325 mg tid for 2-3 weeks   WBAT on the right leg.  Dressing to remain in place until follow in clinic in 2 weeks.  Dressing is waterproof and may shower with it in place.  Follow up in 2 weeks at [facility]. Follow up with OLIN,Lakenzie Mcclafferty D in 2 weeks.  Contact information:  [facility] [address] [place] [address] [phone]            Lanney Gins PA-C  [facility]  [facility] [place] [address], [place], [place] [address] Phone: [phone] [email or web address] Facebook  [employer]

## 2019-08-26 NOTE — Plan of Care (Signed)

## 2019-08-26 NOTE — Progress Notes (Signed)
Physical Therapy Treatment Patient Details Name: Abigail Fowler MRN: [id number] DOB: Mar 19, 1935 Today's Date: 08/26/2019    History of Present Illness 84 y.o. female with medical history significant of MCI, HTN, CAD s/p DES to LAD in 2007, Alzheimers dementia and admitted with right femoral neck hip fracture.  Pt s/p right direct anterior THA 08/24/19    PT Comments    Pt ambulated short distance and practiced safe stair technique.  Pt reports more "burning" pain today and a little slower to mobilize however daughter present and observed session.  Daughter encouraged to use gait belt for safety initially upon d/c.  Pt and daughter anticipate d/c home today and had no further questions.     Follow Up Recommendations  Supervision/Assistance - 24 hour;Home health PT     Equipment Recommendations  Rolling walker with 5" wheels    Recommendations for Other Services       Precautions / Restrictions Precautions Precautions: Fall Restrictions Weight Bearing Restrictions: No RLE Weight Bearing: Weight bearing as tolerated    Mobility  Bed Mobility Overal bed mobility: Needs Assistance Bed Mobility: Supine to Sit;Sit to Supine     Supine to sit: Min guard Sit to supine: Min assist   General bed mobility comments: cues for technique; assist for R LE for pain control  Transfers Overall transfer level: Needs assistance Equipment used: Rolling walker (2 wheeled) Transfers: Sit to/from Stand Sit to Stand: Min guard         General transfer comment: verbal cues for UE positioning  Ambulation/Gait Ambulation/Gait assistance: Min guard;Min assist Gait Distance (Feet): 50 Feet Assistive device: Rolling walker (2 wheeled) Gait Pattern/deviations: Step-through pattern;Decreased stride length;Narrow base of support;Antalgic     General Gait Details: verbal cues for use of RW and UEs through RW for pain control, distance to tolerance; required min assist once for LOB while turning  head to look back for her daughter   Stairs Stairs: Yes Stairs assistance: Min guard Stair Management: Step to pattern;Forwards;Two rails Number of Stairs: 3 General stair comments: max verbal cues for sequence, daughter observed   Wheelchair Mobility    Modified Rankin (Stroke Patients Only)       Balance                                            Cognition Arousal/Alertness: Awake/alert Behavior During Therapy: WFL for tasks assessed/performed Overall Cognitive Status: History of cognitive impairments - at baseline                                 General Comments: pleasant and follows simple commands, hx dementia      Exercises      General Comments        Pertinent Vitals/Pain Pain Assessment: Faces Faces Pain Scale: Hurts little more Pain Location: right hip with movement Pain Descriptors / Indicators: Sore Pain Intervention(s): Repositioned;Monitored during session    Home Living                      Prior Function            PT Goals (current goals can now be found in the care plan section) Progress towards PT goals: Progressing toward goals    Frequency    Min 3X/week  PT Plan Current plan remains appropriate    Co-evaluation              AM-PAC PT "6 Clicks" Mobility   Outcome Measure  Help needed turning from your back to your side while in a flat bed without using bedrails?: A Little Help needed moving from lying on your back to sitting on the side of a flat bed without using bedrails?: A Little Help needed moving to and from a bed to a chair (including a wheelchair)?: A Little Help needed standing up from a chair using your arms (e.g., wheelchair or bedside chair)?: A Little Help needed to walk in hospital room?: A Little Help needed climbing 3-5 steps with a railing? : A Little 6 Click Score: 18    End of Session Equipment Utilized During Treatment: Gait belt Activity  Tolerance: Patient tolerated treatment well Patient left: with call bell/phone within reach;in bed;with bed alarm set;with family/visitor present Nurse Communication: Mobility status PT Visit Diagnosis: Other abnormalities of gait and mobility (R26.89)     Time: 9485-4627 PT Time Calculation (min) (ACUTE ONLY): 29 min  Charges:  $Gait Training: 8-22 mins                    Paulino Door, DPT Acute Rehabilitation Services Pager: [phone] Office: [phone]  Maida Sale E 08/26/2019, 1:04 PM

## 2019-08-26 NOTE — Plan of Care (Signed)
Discharge instructions were given to Pt and her daughter. All questions were answered. Discussed pain management.

## 2021-02-02 IMAGING — CT CT HEAD W/O CM
3 series · 15 of 47 positions shown, 18 images · non-contrast
Comparison: No pertinent prior exams are available for comparison.

CLINICAL DATA: Head trauma, minor. Additional provided: Fall [REDACTED] night hitting back of head.

EXAM:
CT HEAD WITHOUT CONTRAST
TECHNIQUE: Contiguous axial images were obtained from the base of the skull
through the vertex without intravenous contrast.

[Series 2: head wo · axial · 0.47mm/px · z∈[-113,+17]mm · 9 of 32 slices shown, 12 images]
[im 3/32  brain]
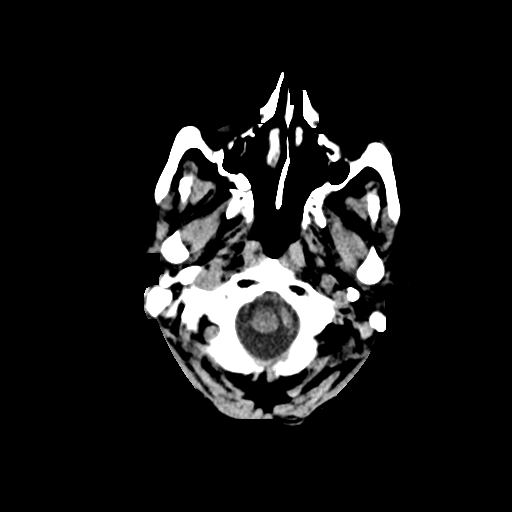
[im 3/32  bone]
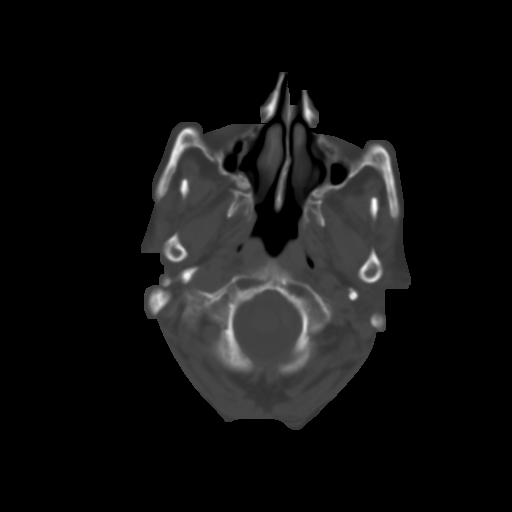
[im 6/32  brain]
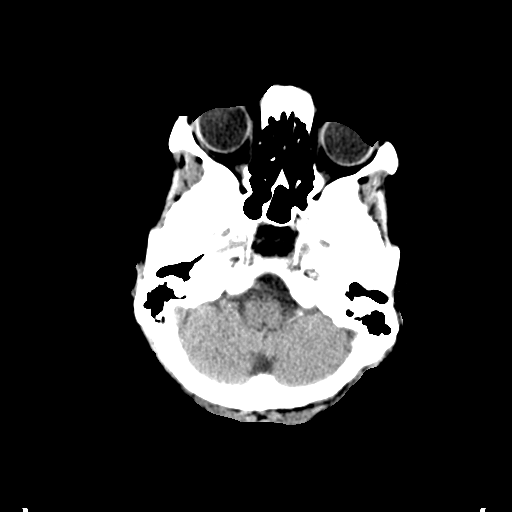
[im 9/32  brain]
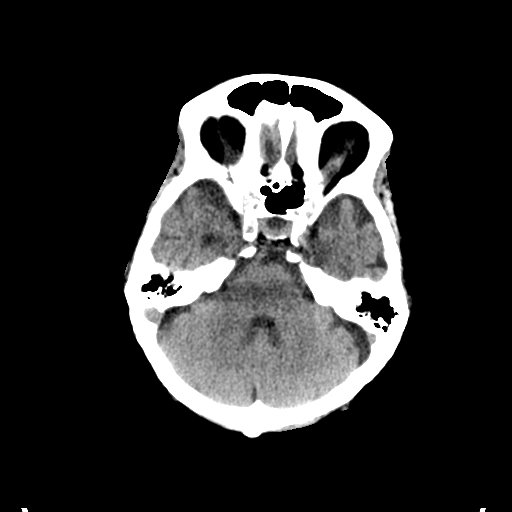
[im 12/32  brain]
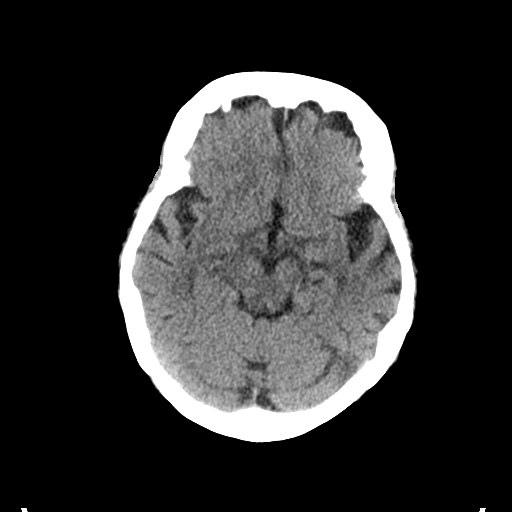
[im 17/32  brain]
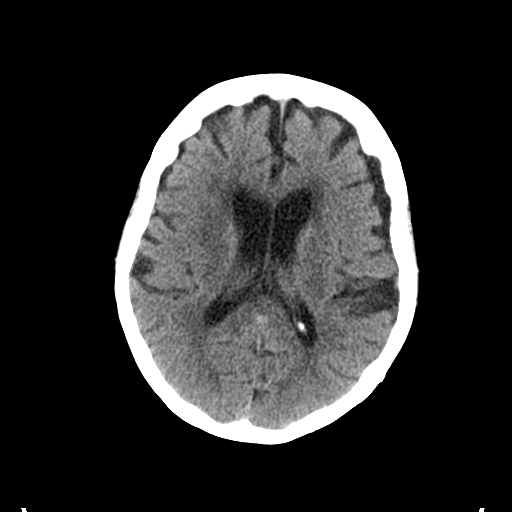
[im 17/32  bone]
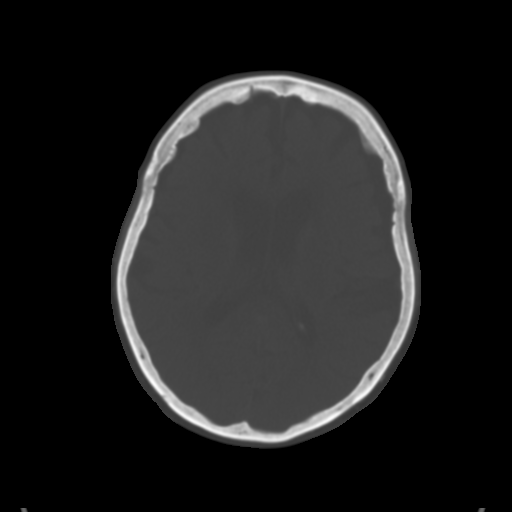
[im 20/32  brain]
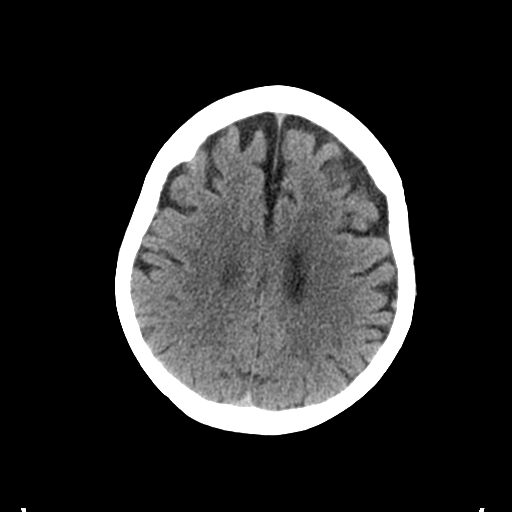
[im 23/32  brain]
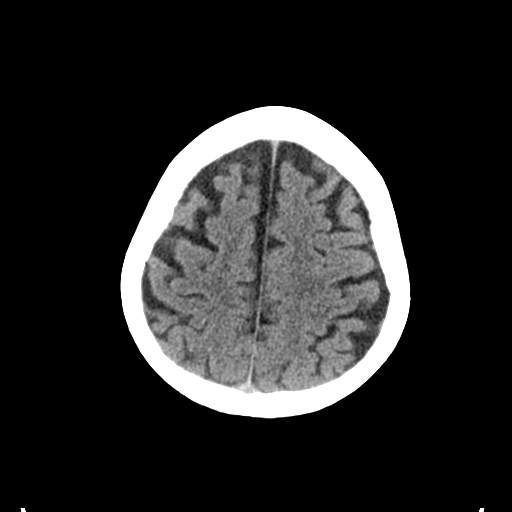
[im 26/32  brain]
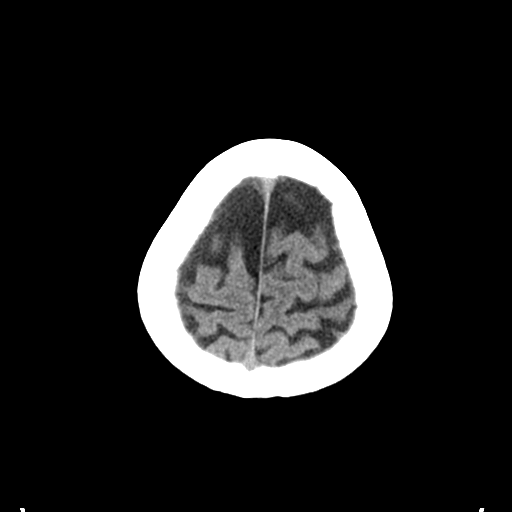
[im 29/32  brain]
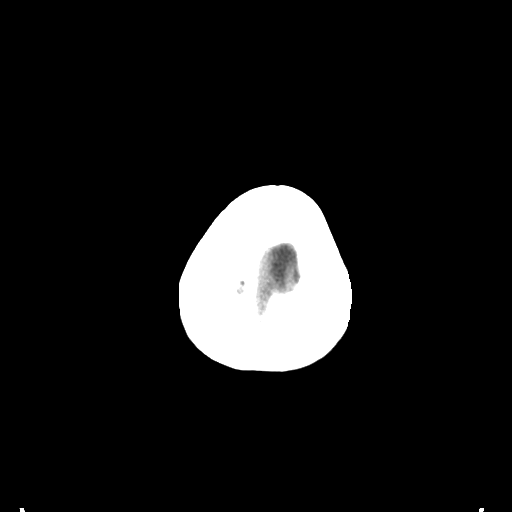
[im 29/32  bone]
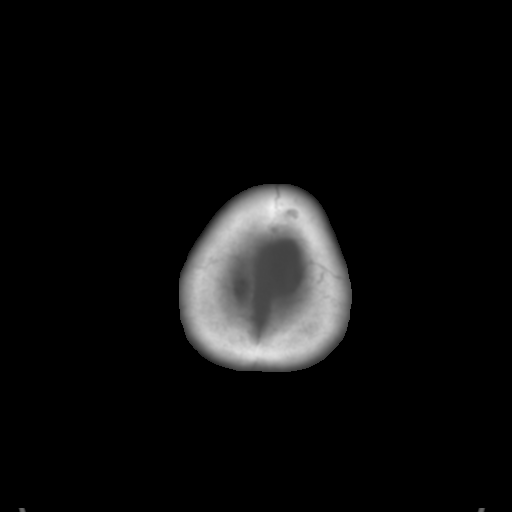

[Series 4: coronal soft tissue · coronal · 0.31mm/px · 3 of 61 slices shown]
[im 21/61  brain]
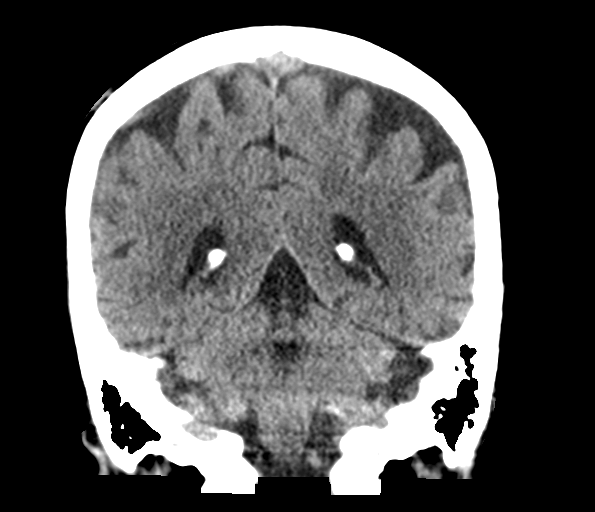
[im 27/61  brain]
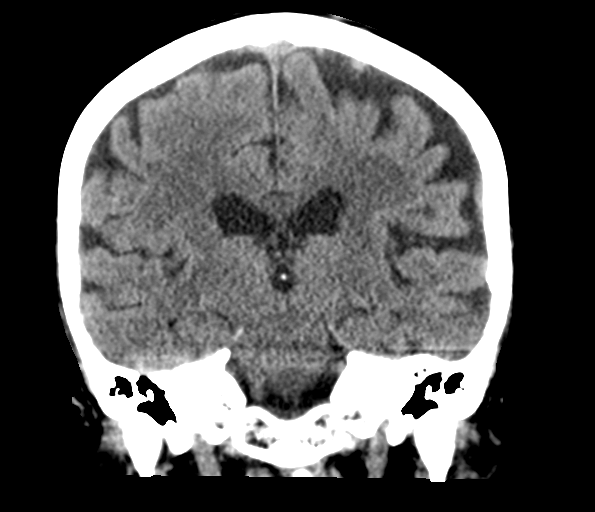
[im 34/61  brain]
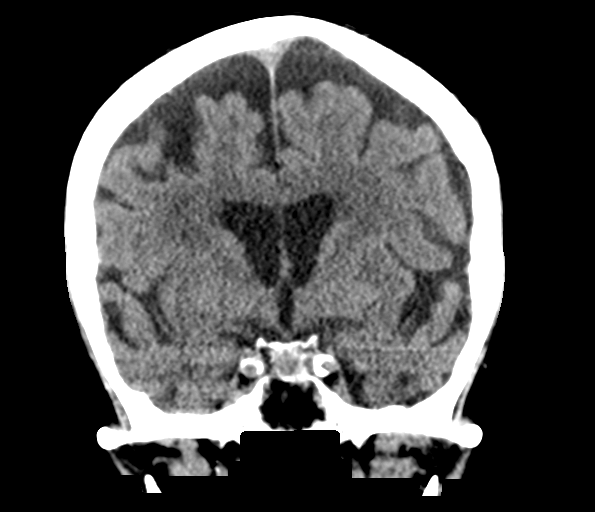

[Series 5: sagittal soft tissue · sagittal · 0.31mm/px · 3 of 49 slices shown]
[im 17/49  brain]
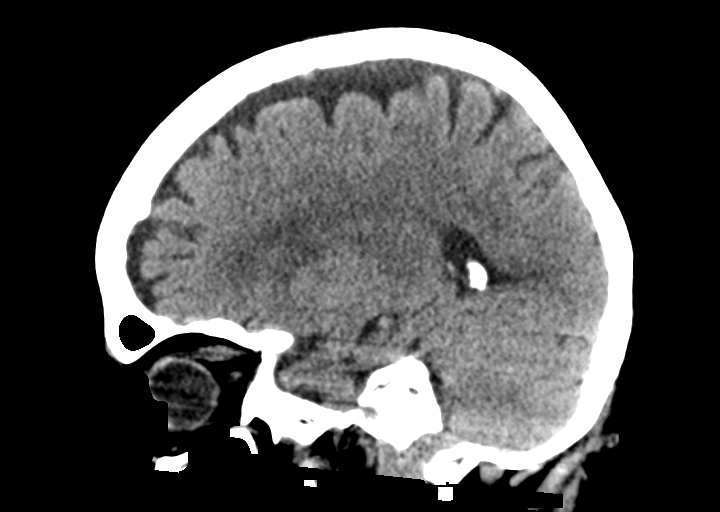
[im 25/49  brain]
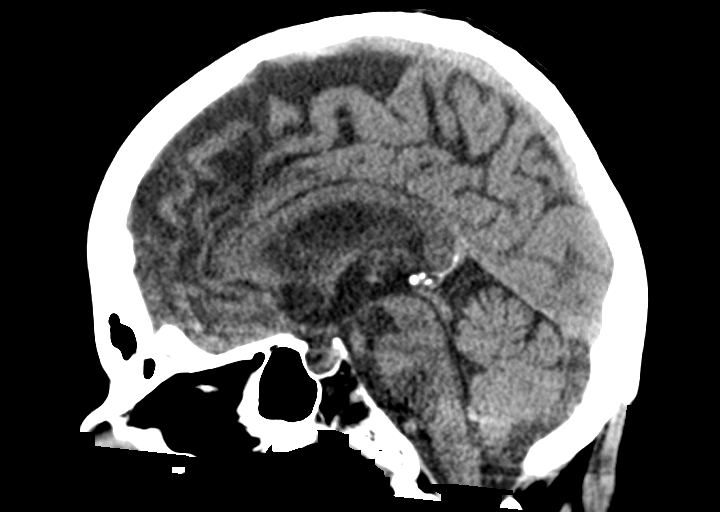
[im 33/49  brain]
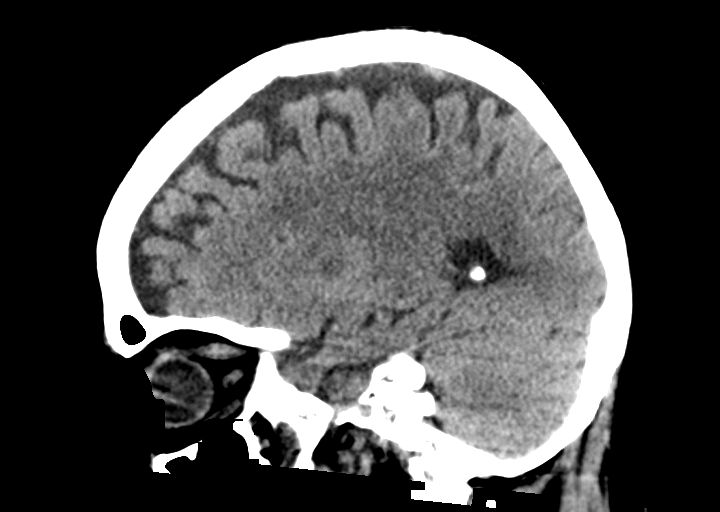

[15 of 47 positions shown; findings below may reference images not displayed]

FINDINGS: Brain:

Mild-to-moderate generalized parenchymal atrophy.

Mild to moderate ill-defined hypoattenuation within the cerebral
white matter which is nonspecific, but consistent with chronic small
vessel ischemic disease.

There is no acute intracranial hemorrhage.

No demarcated cortical infarct.

No extra-axial fluid collection.

No evidence of intracranial mass.

No midline shift.

Vascular: No hyperdense vessel.  Atherosclerotic calcifications

Skull: Normal. Negative for fracture or focal lesion.

Sinuses/Orbits: Visualized orbits show no acute finding. Minimal
ethmoid and maxillary sinus mucosal thickening. No significant
mastoid effusion.
IMPRESSION: No CT evidence of acute intracranial abnormality.

Mild-to-moderate generalized parenchymal atrophy and chronic small
vessel ischemic disease.

Mild paranasal sinus mucosal thickening.
